# Patient Record
Sex: Female | Born: 1940 | Race: White | Hispanic: No | Smoking: Former smoker
Health system: Southern US, Community
[De-identification: ages and names within clinical notes are randomized; demographics above are authoritative.]

## PROBLEM LIST (undated history)

## (undated) DIAGNOSIS — D496 Neoplasm of unspecified behavior of brain: Secondary | ICD-10-CM

## (undated) DIAGNOSIS — K635 Polyp of colon: Secondary | ICD-10-CM

## (undated) DIAGNOSIS — I639 Cerebral infarction, unspecified: Secondary | ICD-10-CM

## (undated) DIAGNOSIS — I739 Peripheral vascular disease, unspecified: Secondary | ICD-10-CM

## (undated) DIAGNOSIS — E785 Hyperlipidemia, unspecified: Secondary | ICD-10-CM

## (undated) DIAGNOSIS — K769 Liver disease, unspecified: Secondary | ICD-10-CM

## (undated) DIAGNOSIS — R471 Dysarthria and anarthria: Secondary | ICD-10-CM

## (undated) DIAGNOSIS — H269 Unspecified cataract: Secondary | ICD-10-CM

## (undated) DIAGNOSIS — L309 Dermatitis, unspecified: Secondary | ICD-10-CM

## (undated) DIAGNOSIS — H539 Unspecified visual disturbance: Secondary | ICD-10-CM

## (undated) DIAGNOSIS — I872 Venous insufficiency (chronic) (peripheral): Secondary | ICD-10-CM

## (undated) DIAGNOSIS — I10 Essential (primary) hypertension: Secondary | ICD-10-CM

## (undated) DIAGNOSIS — C50919 Malignant neoplasm of unspecified site of unspecified female breast: Secondary | ICD-10-CM

## (undated) DIAGNOSIS — C539 Malignant neoplasm of cervix uteri, unspecified: Secondary | ICD-10-CM

## (undated) DIAGNOSIS — M8580 Other specified disorders of bone density and structure, unspecified site: Secondary | ICD-10-CM

## (undated) DIAGNOSIS — M1990 Unspecified osteoarthritis, unspecified site: Secondary | ICD-10-CM

## (undated) HISTORY — DX: Malignant neoplasm of unspecified site of unspecified female breast: C50.919

## (undated) HISTORY — PX: CERVICAL CONIZATION W/BX: SHX1330

## (undated) HISTORY — DX: Unspecified visual disturbance: H53.9

## (undated) HISTORY — DX: Hyperlipidemia, unspecified: E78.5

## (undated) HISTORY — DX: Other specified disorders of bone density and structure, unspecified site: M85.80

## (undated) HISTORY — DX: Dysarthria and anarthria: R47.1

## (undated) HISTORY — PX: CATARACT EXTRACTION W/ INTRAOCULAR LENS  IMPLANT, BILATERAL: SHX1307

## (undated) HISTORY — DX: Venous insufficiency (chronic) (peripheral): I87.2

## (undated) HISTORY — DX: Unspecified osteoarthritis, unspecified site: M19.90

## (undated) HISTORY — DX: Polyp of colon: K63.5

## (undated) HISTORY — DX: Unspecified cataract: H26.9

## (undated) HISTORY — DX: Liver disease, unspecified: K76.9

## (undated) HISTORY — DX: Cerebral infarction, unspecified: I63.9

## (undated) HISTORY — PX: TONSILLECTOMY: SUR1361

## (undated) HISTORY — DX: Dermatitis, unspecified: L30.9

## (undated) HISTORY — DX: Essential (primary) hypertension: I10

## (undated) HISTORY — DX: Neoplasm of unspecified behavior of brain: D49.6

## (undated) HISTORY — DX: Peripheral vascular disease, unspecified: I73.9

## (undated) HISTORY — DX: Malignant neoplasm of cervix uteri, unspecified: C53.9

---

## 1999-11-23 ENCOUNTER — Encounter: Admission: RE | Admit: 1999-11-23 | Discharge: 1999-11-23 | Payer: Self-pay | Admitting: Family Medicine

## 1999-11-23 ENCOUNTER — Encounter: Payer: Self-pay | Admitting: Family Medicine

## 2000-12-05 ENCOUNTER — Encounter: Admission: RE | Admit: 2000-12-05 | Discharge: 2000-12-05 | Payer: Self-pay | Admitting: Family Medicine

## 2000-12-05 ENCOUNTER — Encounter: Payer: Self-pay | Admitting: Family Medicine

## 2005-10-03 ENCOUNTER — Ambulatory Visit: Payer: Self-pay | Admitting: Gastroenterology

## 2005-10-20 ENCOUNTER — Ambulatory Visit: Payer: Self-pay | Admitting: Gastroenterology

## 2005-10-20 ENCOUNTER — Encounter (INDEPENDENT_AMBULATORY_CARE_PROVIDER_SITE_OTHER): Payer: Self-pay | Admitting: *Deleted

## 2009-12-06 ENCOUNTER — Encounter: Admission: RE | Admit: 2009-12-06 | Discharge: 2009-12-06 | Payer: Self-pay | Admitting: Family Medicine

## 2010-02-22 ENCOUNTER — Encounter: Admission: RE | Admit: 2010-02-22 | Discharge: 2010-02-22 | Payer: Self-pay | Admitting: Diagnostic Radiology

## 2010-03-15 ENCOUNTER — Ambulatory Visit
Admission: RE | Admit: 2010-03-15 | Discharge: 2010-03-15 | Payer: Self-pay | Source: Home / Self Care | Attending: General Surgery | Admitting: General Surgery

## 2010-03-15 HISTORY — PX: BREAST LUMPECTOMY: SHX2

## 2010-03-23 ENCOUNTER — Ambulatory Visit: Payer: Self-pay | Admitting: Oncology

## 2010-04-13 ENCOUNTER — Ambulatory Visit
Admission: RE | Admit: 2010-04-13 | Discharge: 2010-05-10 | Payer: Self-pay | Source: Home / Self Care | Attending: Radiation Oncology | Admitting: Radiation Oncology

## 2010-04-20 ENCOUNTER — Ambulatory Visit (HOSPITAL_COMMUNITY)
Admission: RE | Admit: 2010-04-20 | Discharge: 2010-04-20 | Payer: Self-pay | Source: Home / Self Care | Attending: Oncology | Admitting: Oncology

## 2010-04-22 ENCOUNTER — Ambulatory Visit: Payer: Self-pay | Admitting: Oncology

## 2010-04-23 LAB — VITAMIN D 25 HYDROXY (VIT D DEFICIENCY, FRACTURES): Vit D, 25-Hydroxy: 41 ng/mL (ref 30–89)

## 2010-05-11 ENCOUNTER — Ambulatory Visit: Payer: Medicare Other | Admitting: Radiation Oncology

## 2010-06-16 ENCOUNTER — Other Ambulatory Visit: Payer: Self-pay | Admitting: Family Medicine

## 2010-06-16 ENCOUNTER — Ambulatory Visit
Admission: RE | Admit: 2010-06-16 | Discharge: 2010-06-16 | Disposition: A | Discharge status: Home / Self Care | Payer: Medicare Other | Source: Ambulatory Visit | Attending: Family Medicine | Admitting: Family Medicine

## 2010-06-16 DIAGNOSIS — R52 Pain, unspecified: Secondary | ICD-10-CM

## 2010-06-21 LAB — CBC
HCT: 43.7 % (ref 36.0–46.0)
MCH: 30.7 pg (ref 26.0–34.0)
MCHC: 33.6 g/dL (ref 30.0–36.0)
MCV: 91.2 fL (ref 78.0–100.0)
RDW: 13.2 % (ref 11.5–15.5)

## 2010-06-21 LAB — PROTIME-INR
INR: 1.05 (ref 0.00–1.49)
Prothrombin Time: 13.9 seconds (ref 11.6–15.2)

## 2010-06-21 LAB — DIFFERENTIAL
Lymphs Abs: 2 10*3/uL (ref 0.7–4.0)
Monocytes Relative: 9 % (ref 3–12)
Neutro Abs: 4.6 10*3/uL (ref 1.7–7.7)
Neutrophils Relative %: 61 % (ref 43–77)

## 2010-06-21 LAB — COMPREHENSIVE METABOLIC PANEL
BUN: 19 mg/dL (ref 6–23)
CO2: 27 mEq/L (ref 19–32)
Calcium: 9.6 mg/dL (ref 8.4–10.5)
Creatinine, Ser: 0.95 mg/dL (ref 0.4–1.2)
GFR calc non Af Amer: 58 mL/min — ABNORMAL LOW (ref >60)
Glucose, Bld: 91 mg/dL (ref 70–99)
Total Protein: 7.3 g/dL (ref 6.0–8.3)

## 2010-06-21 LAB — CANCER ANTIGEN 27.29: CA 27.29: 26 U/mL (ref 0–39)

## 2010-07-22 ENCOUNTER — Other Ambulatory Visit: Payer: Self-pay | Admitting: Oncology

## 2010-07-22 ENCOUNTER — Encounter (HOSPITAL_BASED_OUTPATIENT_CLINIC_OR_DEPARTMENT_OTHER): Payer: Medicare Other | Admitting: Oncology

## 2010-07-22 DIAGNOSIS — C50919 Malignant neoplasm of unspecified site of unspecified female breast: Secondary | ICD-10-CM

## 2010-07-22 LAB — CBC WITH DIFFERENTIAL/PLATELET
BASO%: 0.7 % (ref 0.0–2.0)
EOS%: 3 % (ref 0.0–7.0)
HCT: 38.5 % (ref 34.8–46.6)
HGB: 12.8 g/dL (ref 11.6–15.9)
MCH: 29.7 pg (ref 25.1–34.0)
MCHC: 33.3 g/dL (ref 31.5–36.0)
MONO#: 0.6 10*3/uL (ref 0.1–0.9)
NEUT%: 66.7 % (ref 38.4–76.8)
RDW: 13.4 % (ref 11.2–14.5)
WBC: 7.3 10*3/uL (ref 3.9–10.3)
lymph#: 1.6 10*3/uL (ref 0.9–3.3)

## 2010-07-22 LAB — VITAMIN D 25 HYDROXY (VIT D DEFICIENCY, FRACTURES): Vit D, 25-Hydroxy: 45 ng/mL (ref 30–89)

## 2010-07-22 LAB — COMPREHENSIVE METABOLIC PANEL
ALT: 13 U/L (ref 0–35)
AST: 18 U/L (ref 0–37)
Albumin: 4.1 g/dL (ref 3.5–5.2)
CO2: 26 mEq/L (ref 19–32)
Calcium: 9.7 mg/dL (ref 8.4–10.5)
Chloride: 98 mEq/L (ref 96–112)
Creatinine, Ser: 0.81 mg/dL (ref 0.40–1.20)
Potassium: 3.8 mEq/L (ref 3.5–5.3)
Total Protein: 6.9 g/dL (ref 6.0–8.3)

## 2010-11-10 ENCOUNTER — Other Ambulatory Visit: Payer: Self-pay | Admitting: Family Medicine

## 2010-11-10 ENCOUNTER — Ambulatory Visit
Admission: RE | Admit: 2010-11-10 | Discharge: 2010-11-10 | Disposition: A | Discharge status: Home / Self Care | Payer: Medicare Other | Source: Ambulatory Visit | Attending: Family Medicine | Admitting: Family Medicine

## 2010-11-10 DIAGNOSIS — M25561 Pain in right knee: Secondary | ICD-10-CM

## 2010-11-10 DIAGNOSIS — M25562 Pain in left knee: Secondary | ICD-10-CM

## 2010-12-26 ENCOUNTER — Ambulatory Visit (INDEPENDENT_AMBULATORY_CARE_PROVIDER_SITE_OTHER): Payer: Medicare Other | Admitting: General Surgery

## 2010-12-26 ENCOUNTER — Encounter (INDEPENDENT_AMBULATORY_CARE_PROVIDER_SITE_OTHER): Payer: Self-pay | Admitting: General Surgery

## 2010-12-26 VITALS — BP 138/88 | HR 72

## 2010-12-26 DIAGNOSIS — C50412 Malignant neoplasm of upper-outer quadrant of left female breast: Secondary | ICD-10-CM | POA: Insufficient documentation

## 2010-12-26 DIAGNOSIS — C50912 Malignant neoplasm of unspecified site of left female breast: Secondary | ICD-10-CM

## 2010-12-26 DIAGNOSIS — C50919 Malignant neoplasm of unspecified site of unspecified female breast: Secondary | ICD-10-CM

## 2010-12-26 NOTE — Progress Notes (Signed)
Operation:  Left partial mastectomy and sentinel lymph node biopsy for left breast cancer  Date:  03/15/2010  Stage:  T1cN0  Hormone receptor status:  Positive  HPI:  Tracy Norman is here for followup of her left breast cancer. Overall she's doing well. She denies any masses in her breast. She denies any skin changes. She denies any adenopathy.  PE:  Gen.-well-developed, well-nourished, in no acute distress.  Left breast-well-healed lateral scar; no dominant mass, suspicious skin change, or nipple discharge.  Right breast-no dominant mass, suspicious skin change, or nipple discharge.  Assessment: T1cN0 left breast cancer-no clinical evidence of recurrence; mammogram due in October of this year.  Plan:  Return visit in 4 months.

## 2011-01-17 ENCOUNTER — Encounter (HOSPITAL_BASED_OUTPATIENT_CLINIC_OR_DEPARTMENT_OTHER): Payer: Medicare Other | Admitting: Oncology

## 2011-01-17 ENCOUNTER — Other Ambulatory Visit: Payer: Self-pay | Admitting: Physician Assistant

## 2011-01-17 DIAGNOSIS — C50919 Malignant neoplasm of unspecified site of unspecified female breast: Secondary | ICD-10-CM

## 2011-01-17 DIAGNOSIS — E559 Vitamin D deficiency, unspecified: Secondary | ICD-10-CM

## 2011-01-17 LAB — CBC WITH DIFFERENTIAL/PLATELET
Basophils Absolute: 0 10*3/uL (ref 0.0–0.1)
Eosinophils Absolute: 0.2 10*3/uL (ref 0.0–0.5)
HCT: 37.3 % (ref 34.8–46.6)
HGB: 12.5 g/dL (ref 11.6–15.9)
MONO#: 0.5 10*3/uL (ref 0.1–0.9)
NEUT%: 70.1 % (ref 38.4–76.8)
WBC: 7.4 10*3/uL (ref 3.9–10.3)
lymph#: 1.5 10*3/uL (ref 0.9–3.3)

## 2011-01-17 LAB — COMPREHENSIVE METABOLIC PANEL
ALT: <8 U/L (ref 0–35)
BUN: 25 mg/dL — ABNORMAL HIGH (ref 6–23)
CO2: 25 mEq/L (ref 19–32)
Calcium: 9.9 mg/dL (ref 8.4–10.5)
Chloride: 102 mEq/L (ref 96–112)
Creatinine, Ser: 0.9 mg/dL (ref 0.50–1.10)

## 2011-01-18 ENCOUNTER — Encounter: Payer: Medicare Other | Admitting: Oncology

## 2011-05-13 ENCOUNTER — Telehealth: Payer: Self-pay | Admitting: *Deleted

## 2011-05-13 NOTE — Telephone Encounter (Signed)
Notified pt of future appts.(07/19/2011 , lab @1130  and Dr Donnie Coffin @1200 )

## 2011-05-15 ENCOUNTER — Other Ambulatory Visit: Payer: Self-pay | Admitting: *Deleted

## 2011-05-15 DIAGNOSIS — C50919 Malignant neoplasm of unspecified site of unspecified female breast: Secondary | ICD-10-CM

## 2011-05-15 MED ORDER — ANASTROZOLE 1 MG PO TABS: 1.0000 mg | ORAL_TABLET | Freq: Every day | ORAL | Status: DC

## 2011-05-16 ENCOUNTER — Ambulatory Visit (INDEPENDENT_AMBULATORY_CARE_PROVIDER_SITE_OTHER): Payer: Medicare Other | Admitting: General Surgery

## 2011-05-16 ENCOUNTER — Encounter (INDEPENDENT_AMBULATORY_CARE_PROVIDER_SITE_OTHER): Payer: Self-pay | Admitting: General Surgery

## 2011-05-16 VITALS — BP 122/76 | HR 70 | Temp 97.8°F | Resp 18 | Ht 60.0 in | Wt 178.6 lb

## 2011-05-16 DIAGNOSIS — Z853 Personal history of malignant neoplasm of breast: Secondary | ICD-10-CM

## 2011-05-16 NOTE — Patient Instructions (Signed)
Call us if you find any new lumps in our breast.

## 2011-05-16 NOTE — Progress Notes (Signed)
Operation:  Left partial mastectomy and sentinel lymph node biopsy for left breast cancer  Date:  03/15/2010  Stage:  T1cN0  Hormone receptor status:  Positive  HPI:  Tracy Norman is here for followup of her left breast cancer. Overall she's doing well. She denies any masses in her breast. She denies any skin changes. She denies any adenopathy.  She had a mammogram in October of 2012 that was a BIRADS-2.  PE:  Gen.-well-developed, well-nourished, in no acute distress.  Left breast-well-healed lateral scar; no dominant mass, suspicious skin change, or nipple discharge.  Right breast-no dominant mass, suspicious skin change, or nipple discharge.  Assessment: T1cN0 left breast cancer-no clinical or radiographic evidence of recurrence.  Plan:  Return visit in 4 months.

## 2011-07-14 ENCOUNTER — Telehealth: Payer: Self-pay | Admitting: *Deleted

## 2011-07-14 NOTE — Telephone Encounter (Signed)
left voice message to inform the patient of the new date and time 07-17-2011 starting at 11:00am

## 2011-07-17 ENCOUNTER — Ambulatory Visit: Payer: Medicare Other | Admitting: Oncology

## 2011-07-17 ENCOUNTER — Other Ambulatory Visit: Payer: Medicare Other | Admitting: Lab

## 2011-07-19 ENCOUNTER — Ambulatory Visit: Payer: Medicare Other | Admitting: Oncology

## 2011-07-19 ENCOUNTER — Other Ambulatory Visit: Payer: Medicare Other | Admitting: Lab

## 2011-07-31 ENCOUNTER — Encounter (INDEPENDENT_AMBULATORY_CARE_PROVIDER_SITE_OTHER): Payer: Self-pay | Admitting: *Deleted

## 2011-08-07 ENCOUNTER — Telehealth: Payer: Self-pay | Admitting: *Deleted

## 2011-08-07 NOTE — Telephone Encounter (Signed)
left message with patient's mother about 08-10-2011 asked that the patient please call dr.rubin's office for the new date and time on 08-17-2011 at 3:15pm

## 2011-08-10 ENCOUNTER — Telehealth: Payer: Self-pay | Admitting: Oncology

## 2011-08-10 ENCOUNTER — Ambulatory Visit: Payer: Medicare Other | Admitting: Oncology

## 2011-08-10 NOTE — Telephone Encounter (Signed)
Pt called to confirm appt for 5/9

## 2011-08-17 ENCOUNTER — Other Ambulatory Visit: Payer: Self-pay | Admitting: *Deleted

## 2011-08-17 ENCOUNTER — Ambulatory Visit (HOSPITAL_BASED_OUTPATIENT_CLINIC_OR_DEPARTMENT_OTHER): Payer: Medicare Other | Admitting: Physician Assistant

## 2011-08-17 ENCOUNTER — Telehealth: Payer: Self-pay | Admitting: *Deleted

## 2011-08-17 ENCOUNTER — Encounter: Payer: Self-pay | Admitting: Physician Assistant

## 2011-08-17 VITALS — BP 120/75 | HR 101 | Temp 98.4°F | Ht 60.0 in | Wt 186.9 lb

## 2011-08-17 DIAGNOSIS — C50919 Malignant neoplasm of unspecified site of unspecified female breast: Secondary | ICD-10-CM

## 2011-08-17 DIAGNOSIS — C50419 Malignant neoplasm of upper-outer quadrant of unspecified female breast: Secondary | ICD-10-CM

## 2011-08-17 DIAGNOSIS — C50912 Malignant neoplasm of unspecified site of left female breast: Secondary | ICD-10-CM

## 2011-08-17 MED ORDER — ANASTROZOLE 1 MG PO TABS: 1.0000 mg | ORAL_TABLET | Freq: Every day | ORAL | Status: DC

## 2011-08-17 NOTE — Telephone Encounter (Signed)
gave patient appointment for 02-2012 printed out calendar and gave to the patient 

## 2011-08-17 NOTE — Progress Notes (Signed)
Hematology and Oncology Follow Up Visit  Tracy Norman [id number] 03-21-1941 71 y.o. 08/17/2011    HPI: Tracy Norman is a 71 year old [place] woman with a history of a stage I, ER/PR positive (75/10% respectively), HER-2 negative left breast carcinoma diagnosed November of 2011. 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 She completed radiation therapy, and is currently on anastrozole 1 mg by mouth daily.  Interim History:   Tracy Norman is seen today for follow pertain to her history of a stage I ER/PR positive left breast carcinoma. She continues on anastrozole 1 mg per day, and tolerating it quite well. She underwent her annual mammogram at [facility] in October of 2012 and all was well. She denies any fevers, chills, night sweats, shortness of breath, or chest pain. No nausea, emesis, diarrhea, or constipation issues. She denies any diffuse bone pain, no joint sensitivity. She is not troubled overly by hot flashes. Her medication list has been reviewed and updated. A detailed review of systems is otherwise noncontributory as noted below.  Review of Systems: Constitutional:  no weight loss, fever, night sweats and feels well Eyes: no complaints ENT: no complaints Cardiovascular: no chest pain or dyspnea on exertion Respiratory: no cough, shortness of breath, or wheezing Neurological: no TIA or stroke symptoms Dermatological: negative Gastrointestinal: no abdominal pain, change in bowel habits, or black or bloody stools Genito-Urinary: no dysuria, trouble voiding, or hematuria Hematological and Lymphatic: negative Breast: negative Musculoskeletal: negative Remaining ROS negative.  Medications:   I have reviewed the patient's current medications.  Current Outpatient Prescriptions  Medication Sig Dispense Refill  . amiloride-hydrochlorothiazide  (MODURETIC) 5-50 MG tablet       . anastrozole (ARIMIDEX) 1 MG tablet Take 1 tablet (1 mg total) by mouth daily.  30 tablet  0  . aspirin 81 MG tablet Take 81 mg by mouth daily.        . calcium carbonate (OS-CAL) 600 MG TABS Take 600 mg by mouth 2 (two) times daily with a meal.      . Cholecalciferol (VITAMIN D PO) Take 200 mg elemental calcium/kg/hr by mouth.        . etodolac (LODINE) 400 MG tablet Take 400 mg by mouth daily.      Marland Kitchen lisinopril (PRINIVIL,ZESTRIL) 40 MG tablet       . omeprazole (PRILOSEC) 20 MG capsule       . potassium chloride (K-DUR) 10 MEQ tablet       . simvastatin (ZOCOR) 20 MG tablet Take 20 mg by mouth daily.        Allergies:  Allergies  Allergen Reactions  . Penicillins Hives    All over the body.    Physical Exam: Filed Vitals:   08/17/11 1456  BP: 120/75  Pulse: 101  Temp: 98.4 F (36.9 C)    Body mass index is 36.50 kg/(m^2). Weight: 186 lbs. HEENT:  Sclerae anicteric, conjunctivae pink.  Oropharynx clear.  No mucositis or candidiasis.   Nodes:  No cervical, supraclavicular, or axillary lymphadenopathy palpated.  Breast Exam:  Left breast lumpectomy scar is benign without any dominant masses no nipple inversion or discharge axilla is clear, the right breast is free of masses skin changes nipple inversion or discharge axilla clear.  Lungs:  Clear to auscultation bilaterally.  No crackles, rhonchi, or wheezes.   Heart:  Regular rate and rhythm.   Abdomen:  Soft, nontender.  Positive bowel sounds.  No organomegaly or masses palpated.   Musculoskeletal:  No focal spinal tenderness to palpation.  Extremities:  Benign.  No peripheral edema or cyanosis.   Skin:  Benign.   Neuro:  Nonfocal, alert and oriented x 3.   Lab Results: Lab Results  Component Value Date   WBC 7.4 01/17/2011   HGB 12.5 01/17/2011   HCT 37.3 01/17/2011   MCV 88.0 01/17/2011   PLT 290 01/17/2011   NEUTROABS 5.2 01/17/2011     Chemistry      Component Value Date/Time   NA  140 01/17/2011 1348   K 4.2 01/17/2011 1348   CL 102 01/17/2011 1348   CO2 25 01/17/2011 1348   BUN 25* 01/17/2011 1348   CREATININE 0.90 01/17/2011 1348      Component Value Date/Time   CALCIUM 9.9 01/17/2011 1348   ALKPHOS 98 01/17/2011 1348   AST 15 01/17/2011 1348   ALT <8 01/17/2011 1348   BILITOT 0.4 01/17/2011 1348      Lab Results  Component Value Date   LABCA2 26 03/14/2010    Assessment:  Tracy Norman is a 71 year old [place] woman with a history of a stage I, ER/PR positive (75/10% respectively), HER-2 negative left breast carcinoma diagnosed November of 2011. 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 She completed radiation therapy, and is currently on anastrozole 1 mg by mouth daily.  No evidence of suggest recurrent or metastatic disease.  Case to be reviewed with Dr. Pierce Crane upon his return.  Plan:  Patient will continue on anastrozole 1 mg by mouth daily. Refill prescription for 90 day supply was provided. I've also given her prescription for a CA 27-29 to be obtained with her next primary care lab office straw which will be within the next month. We will see her back in 6 months for routine oncologic followup, to include a CBC, serum chemistries, LDH, and CA 27-29. Her primary care physician Dr. Duaine Dredge schedules her annual mammograms through [facility] in October. She knows that we would be happy to see her prior to her six-month followup if the need should arise. This plan was reviewed with the patient, who voices understanding and agreement.  She knows to call with any changes or problems.    Ngoc Daughtridge T, PA-C 08/17/2011

## 2011-09-25 ENCOUNTER — Encounter (INDEPENDENT_AMBULATORY_CARE_PROVIDER_SITE_OTHER): Payer: Self-pay | Admitting: General Surgery

## 2011-09-25 ENCOUNTER — Ambulatory Visit (INDEPENDENT_AMBULATORY_CARE_PROVIDER_SITE_OTHER): Payer: Medicare Other | Admitting: General Surgery

## 2011-09-25 VITALS — BP 130/68 | HR 72 | Temp 97.9°F | Resp 16 | Ht 60.0 in | Wt 184.0 lb

## 2011-09-25 DIAGNOSIS — Z853 Personal history of malignant neoplasm of breast: Secondary | ICD-10-CM

## 2011-09-25 NOTE — Progress Notes (Signed)
Operation:  Left partial mastectomy and sentinel lymph node biopsy for left breast cancer  Date:  03/15/2010  Stage:  T1cN0  Hormone receptor status:  Positive  HPI:  Tracy Norman is here for followup of her left breast cancer.  She denies any masses in her breast. She denies any adenopathy.  She had a mammogram in October of 2012 that was a BIRADS-2.  She is tolerating the Arimidex.  PE:  Gen.-well-developed, well-nourished, in no acute distress.  Left breast-well-healed lateral scar; no dominant mass, suspicious skin change, or nipple discharge.  Right breast-no dominant mass, suspicious skin change, or nipple discharge.  Assessment: T1cN0 left breast cancer-no clinical  evidence of recurrence.  Plan:  Return visit in 3 months.

## 2011-09-25 NOTE — Patient Instructions (Signed)
Call us if you discover any new breast lumps.

## 2012-01-01 ENCOUNTER — Ambulatory Visit (INDEPENDENT_AMBULATORY_CARE_PROVIDER_SITE_OTHER): Payer: Medicare Other | Admitting: General Surgery

## 2012-01-01 ENCOUNTER — Encounter (INDEPENDENT_AMBULATORY_CARE_PROVIDER_SITE_OTHER): Payer: Self-pay | Admitting: General Surgery

## 2012-01-01 VITALS — BP 138/76 | HR 88 | Temp 97.3°F | Resp 16 | Ht 60.0 in | Wt 190.2 lb

## 2012-01-01 DIAGNOSIS — Z853 Personal history of malignant neoplasm of breast: Secondary | ICD-10-CM

## 2012-01-01 NOTE — Progress Notes (Signed)
Operation:  Left partial mastectomy and sentinel lymph node biopsy for left breast cancer  Date:  03/15/2010  Stage:  T1cN0  Hormone receptor status:  Positive  HPI:  Tracy Norman is here for followup of her left breast cancer.  She denies any masses in her breast. She denies any adenopathy.  She is tolerating the Arimidex.  PE:  Gen.-well-developed, well-nourished, in no acute distress.  Left breast-well-healed lateral scar; no dominant mass, suspicious skin change, or nipple discharge.  Right breast-no dominant mass, suspicious skin change, or nipple discharge.  Assessment: T1cN0 left breast cancer-no clinical  evidence of recurrence.  Plan:  Return visit in 3 months.

## 2012-01-01 NOTE — Patient Instructions (Signed)
Call if you feel any new masses in your breasts. 

## 2012-02-19 ENCOUNTER — Telehealth: Payer: Self-pay | Admitting: *Deleted

## 2012-02-19 NOTE — Telephone Encounter (Signed)
per staff message moved patient appointment to 02-26-2012 starting at1:00pm

## 2012-02-20 ENCOUNTER — Other Ambulatory Visit: Payer: Medicare Other | Admitting: Lab

## 2012-02-20 ENCOUNTER — Ambulatory Visit: Payer: Medicare Other | Admitting: Oncology

## 2012-02-26 ENCOUNTER — Ambulatory Visit (HOSPITAL_BASED_OUTPATIENT_CLINIC_OR_DEPARTMENT_OTHER): Payer: Medicare Other | Admitting: Oncology

## 2012-02-26 ENCOUNTER — Telehealth: Payer: Self-pay | Admitting: Oncology

## 2012-02-26 ENCOUNTER — Other Ambulatory Visit (HOSPITAL_BASED_OUTPATIENT_CLINIC_OR_DEPARTMENT_OTHER): Payer: Medicare Other | Admitting: Lab

## 2012-02-26 VITALS — BP 119/67 | HR 83 | Temp 98.2°F | Resp 18 | Ht 60.0 in | Wt 189.1 lb

## 2012-02-26 DIAGNOSIS — C50912 Malignant neoplasm of unspecified site of left female breast: Secondary | ICD-10-CM

## 2012-02-26 DIAGNOSIS — C50919 Malignant neoplasm of unspecified site of unspecified female breast: Secondary | ICD-10-CM

## 2012-02-26 DIAGNOSIS — E559 Vitamin D deficiency, unspecified: Secondary | ICD-10-CM

## 2012-02-26 DIAGNOSIS — C50419 Malignant neoplasm of upper-outer quadrant of unspecified female breast: Secondary | ICD-10-CM

## 2012-02-26 LAB — CBC WITH DIFFERENTIAL/PLATELET
BASO%: 0.7 % (ref 0.0–2.0)
EOS%: 5.3 % (ref 0.0–7.0)
HCT: 40.1 % (ref 34.8–46.6)
LYMPH%: 20.8 % (ref 14.0–49.7)
MCH: 31.8 pg (ref 25.1–34.0)
MCHC: 34.5 g/dL (ref 31.5–36.0)
NEUT%: 65.4 % (ref 38.4–76.8)
RBC: 4.35 10*6/uL (ref 3.70–5.45)
lymph#: 1.3 10*3/uL (ref 0.9–3.3)

## 2012-02-26 LAB — COMPREHENSIVE METABOLIC PANEL (CC13)
Alkaline Phosphatase: 93 U/L (ref 40–150)
BUN: 34 mg/dL — ABNORMAL HIGH (ref 7.0–26.0)
Glucose: 98 mg/dl (ref 70–99)
Total Bilirubin: 0.6 mg/dL (ref 0.20–1.20)

## 2012-02-26 NOTE — Telephone Encounter (Signed)
gve the pt her may 2014 appt calendar 

## 2012-02-26 NOTE — Progress Notes (Signed)
Hematology and Oncology Follow Up Visit  Tracy Norman [id number] 1940/11/06 71 y.o. 02/26/2012    HPI: Tracy Norman is a 71 year old [place] woman with a history of a stage I, ER/PR positive (75/10% respectively), HER-2 negative left breast carcinoma diagnosed November of 2011. 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 She completed radiation therapy, and is currently on anastrozole 1 mg by mouth daily.  Interim History:   Tracy Norman is seen today for follow pertain to her history of a stage I ER/PR positive left breast carcinoma. She is been doing well on the Arimidex. She is due for followup mammogram and bone density test later this week. She is other complaints. Her other medications are the same. A detailed review of systems is otherwise noncontributory as noted below.  Review of Systems: Constitutional:  no weight loss, fever, night sweats and feels well Eyes: no complaints ENT: no complaints Cardiovascular: no chest pain or dyspnea on exertion Respiratory: no cough, shortness of breath, or wheezing Neurological: no TIA or stroke symptoms Dermatological: negative Gastrointestinal: no abdominal pain, change in bowel habits, or black or bloody stools Genito-Urinary: no dysuria, trouble voiding, or hematuria Hematological and Lymphatic: negative Breast: negative Musculoskeletal: negative Remaining ROS negative.  Medications:   I have reviewed the patient's current medications.  Current Outpatient Prescriptions  Medication Sig Dispense Refill  . amiloride-hydrochlorothiazide (MODURETIC) 5-50 MG tablet       . anastrozole (ARIMIDEX) 1 MG tablet Take 1 tablet (1 mg total) by mouth daily.  30 tablet  0  . aspirin 81 MG tablet Take 81 mg by mouth daily.        . calcium carbonate (OS-CAL) 600 MG TABS Take 600 mg by mouth 2 (two) times  daily with a meal.      . Cholecalciferol (VITAMIN D PO) Take 200 mg elemental calcium/kg/hr by mouth.        . etodolac (LODINE) 400 MG tablet Take 400 mg by mouth daily.      Marland Kitchen lisinopril (PRINIVIL,ZESTRIL) 40 MG tablet       . omeprazole (PRILOSEC) 20 MG capsule       . potassium chloride (K-DUR) 10 MEQ tablet       . rosuvastatin (CRESTOR) 20 MG tablet Take 20 mg by mouth daily.        Allergies:  Allergies  Allergen Reactions  . Penicillins Hives    All over the body.    Physical Exam: Filed Vitals:   02/26/12 1338  BP: 119/67  Pulse: 83  Temp: 98.2 F (36.8 C)  Resp: 18    Body mass index is 36.93 kg/(m^2). Weight: 186 lbs. HEENT:  Sclerae anicteric, conjunctivae pink.  Oropharynx clear.  No mucositis or candidiasis.   Nodes:  No cervical, supraclavicular, or axillary lymphadenopathy palpated.  Breast Exam:  Left breast lumpectomy scar is benign without any dominant masses no nipple inversion or discharge axilla is clear, the right breast is free of masses skin changes nipple inversion or discharge axilla clear.  Lungs:  Clear to auscultation bilaterally.  No crackles, rhonchi, or wheezes.   Heart:  Regular rate and rhythm.   Abdomen:  Soft, nontender.  Positive bowel sounds.  No organomegaly or masses palpated.   Musculoskeletal:  No focal spinal tenderness to palpation.  Extremities:  Benign.  No peripheral edema or cyanosis.   Skin:  Benign.  Neuro:  Nonfocal, alert and oriented x 3.   Lab Results: Lab Results  Component Value Date   WBC 6.2 02/26/2012   HGB 13.8 02/26/2012   HCT 40.1 02/26/2012   MCV 92.4 02/26/2012   PLT 231 02/26/2012   NEUTROABS 4.1 02/26/2012     Chemistry      Component Value Date/Time   NA 140 02/26/2012 1326   NA 140 01/17/2011 1348   K 4.4 02/26/2012 1326   K 4.2 01/17/2011 1348   CL 106 02/26/2012 1326   CL 102 01/17/2011 1348   CO2 28 02/26/2012 1326   CO2 25 01/17/2011 1348   BUN 34.0* 02/26/2012 1326   BUN 25* 01/17/2011  1348   CREATININE 1.1 02/26/2012 1326   CREATININE 0.90 01/17/2011 1348      Component Value Date/Time   CALCIUM 10.5* 02/26/2012 1326   CALCIUM 9.9 01/17/2011 1348   ALKPHOS 93 02/26/2012 1326   ALKPHOS 98 01/17/2011 1348   AST 23 02/26/2012 1326   AST 15 01/17/2011 1348   ALT 24 02/26/2012 1326   ALT <8 01/17/2011 1348   BILITOT 0.60 02/26/2012 1326   BILITOT 0.4 01/17/2011 1348      Lab Results  Component Value Date   LABCA2 26 03/14/2010    Assessment:  Tracy Norman is a 71 year old [place] woman with a history of a stage I, ER/PR positive (75/10% respectively), HER-2 negative left breast carcinoma diagnosed November of 2011. 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 She completed radiation therapy, and is currently on anastrozole 1 mg by mouth daily.  No evidence of suggest recurrent or metastatic disease.    Plan:  Patient will continue on anastrozole 1 mg by mouth daily. I will continue to see her at six-month intervals. We'll look forward to seeing her results of her bone density test and mammogram. I've encouraged her to take additional vitamin D. as she has been doing. This plan was reviewed with the patient, who voices understanding and agreement.  She knows to call with any changes or problems.    Pierce Crane, MD 02/26/2012

## 2012-02-27 LAB — CANCER ANTIGEN 27.29: CA 27.29: 31 U/mL (ref 0–39)

## 2012-02-28 ENCOUNTER — Ambulatory Visit (INDEPENDENT_AMBULATORY_CARE_PROVIDER_SITE_OTHER): Payer: Medicare Other | Admitting: *Deleted

## 2012-02-28 DIAGNOSIS — I6529 Occlusion and stenosis of unspecified carotid artery: Secondary | ICD-10-CM

## 2012-02-28 DIAGNOSIS — R0989 Other specified symptoms and signs involving the circulatory and respiratory systems: Secondary | ICD-10-CM

## 2012-04-08 ENCOUNTER — Ambulatory Visit (INDEPENDENT_AMBULATORY_CARE_PROVIDER_SITE_OTHER): Payer: Medicare Other | Admitting: General Surgery

## 2012-04-08 ENCOUNTER — Encounter (INDEPENDENT_AMBULATORY_CARE_PROVIDER_SITE_OTHER): Payer: Self-pay | Admitting: General Surgery

## 2012-04-08 VITALS — BP 142/68 | HR 84 | Temp 97.0°F | Resp 12 | Ht 59.75 in | Wt 194.2 lb

## 2012-04-08 DIAGNOSIS — Z853 Personal history of malignant neoplasm of breast: Secondary | ICD-10-CM

## 2012-04-08 NOTE — Progress Notes (Signed)
Operation:  Left partial mastectomy and sentinel lymph node biopsy for left breast cancer  Date:  03/15/2010  Stage:  T1cN0  Hormone receptor status:  Positive  HPI:  Tracy Norman is here for another followup visit for her left breast cancer.  She denies any masses in her breast. She denies any adenopathy.  She is tolerating the Arimidex.  PE:  Gen.-well-developed, well-nourished, in no acute distress.  Left breast-well-healed lateral scar; no dominant mass, suspicious skin change, or nipple discharge.  Right breast-no dominant mass, suspicious skin change, or nipple discharge.  Assessment: T1cN0 left breast cancer-no clinical  evidence of recurrence.  Plan:  Return visit in 6 months.

## 2012-04-08 NOTE — Patient Instructions (Signed)
Call if you find any new lumps in your breasts. 

## 2012-05-02 ENCOUNTER — Encounter (INDEPENDENT_AMBULATORY_CARE_PROVIDER_SITE_OTHER): Payer: Self-pay

## 2012-06-29 ENCOUNTER — Encounter: Payer: Self-pay | Admitting: Oncology

## 2012-06-29 ENCOUNTER — Telehealth: Payer: Self-pay | Admitting: *Deleted

## 2012-06-29 NOTE — Telephone Encounter (Signed)
Confirmed 08/29/12 appt w/ pt.  Mailed letter & calendar to pt.

## 2012-08-29 ENCOUNTER — Telehealth: Payer: Self-pay | Admitting: *Deleted

## 2012-08-29 ENCOUNTER — Ambulatory Visit: Payer: Medicare Other | Admitting: Oncology

## 2012-08-29 ENCOUNTER — Other Ambulatory Visit: Payer: Medicare Other | Admitting: Lab

## 2012-08-29 ENCOUNTER — Ambulatory Visit (HOSPITAL_BASED_OUTPATIENT_CLINIC_OR_DEPARTMENT_OTHER): Payer: Medicare Other | Admitting: Oncology

## 2012-08-29 VITALS — BP 141/78 | HR 98 | Temp 98.4°F | Resp 20 | Ht 59.75 in | Wt 194.0 lb

## 2012-08-29 DIAGNOSIS — Z170 Estrogen receptor positive status [ER+]: Secondary | ICD-10-CM

## 2012-08-29 DIAGNOSIS — M810 Age-related osteoporosis without current pathological fracture: Secondary | ICD-10-CM

## 2012-08-29 DIAGNOSIS — C50919 Malignant neoplasm of unspecified site of unspecified female breast: Secondary | ICD-10-CM

## 2012-08-29 DIAGNOSIS — C50912 Malignant neoplasm of unspecified site of left female breast: Secondary | ICD-10-CM

## 2012-08-29 MED ORDER — ANASTROZOLE 1 MG PO TABS: 1.0000 mg | ORAL_TABLET | Freq: Every day | ORAL | Status: DC

## 2012-08-29 NOTE — Patient Instructions (Addendum)
It was a pleasure meeting you today.  We discussed your pathology and the treatment you have received in the past by Dr. Donnie Coffin.  My recommendation is to continue the Arimidex 1 mg every day. Total of 5 years of therapy is planned.  We discussed side effects of Arimidex including osteoporosis/osteopenia. Dr. Duaine Dredge is monitoring your bone density and he has you on Fosamax which I agree with.  I will plan on seeing you back in 6 months time for followup.  Please call with any problems questions or concerns.

## 2012-08-29 NOTE — Telephone Encounter (Signed)
appts made and printed...td 

## 2012-09-16 NOTE — Progress Notes (Signed)
OFFICE PROGRESS NOTE  CC  Carolyne Fiscal, MD [address] [place] [address]  DIAGNOSIS: 72 year old female with history of left breast cancer diagnosed 2011  PRIOR THERAPY:  #1 patient underwent a left partial mastectomy with sentinel node biopsy for  Left breast cancer on 03/15/2010. The pathology revealed a T1 C. N0 disease it was ER positive75% PR +10% HER-2/neu negative.  #2 she then went on to complete radiation therapy.  #3 she was then begun on anastrozole 1 mg daily. A total of 5 years of therapy is planned.  CURRENT THERAPY:Arimidex 1 mg daily.  INTERVAL HISTORY: BRITNI Norman 72 y.o. female returns forfollowup visit. She previously had been seeing Dr. Pierce Crane. Her last visit with him was in November 2013. Clinically patient seems to be doing well she is tolerating her Arimidex quite nicely without any significant problems.patient does have a little bone density and she was begun on Fosamax by her primary care physician. She has no nausea or vomiting no myalgias and arthralgias. We did discuss her pathology and treatments that she received by Dr. Donnie Coffin today  MEDICAL HISTORY: Past Medical History  Diagnosis Date  . Arthritis   . Cancer   . Hypertension     ALLERGIES:  is allergic to penicillins.  MEDICATIONS:  Current Outpatient Prescriptions  Medication Sig Dispense Refill  . amiloride-hydrochlorothiazide (MODURETIC) 5-50 MG tablet       . anastrozole (ARIMIDEX) 1 MG tablet Take 1 tablet (1 mg total) by mouth daily.  90 tablet  12  . aspirin 81 MG tablet Take 81 mg by mouth daily.        . calcium carbonate (OS-CAL) 600 MG TABS Take 600 mg by mouth 2 (two) times daily with a meal.      . Cholecalciferol (VITAMIN D PO) Take 200 mg elemental calcium/kg/hr by mouth.        . etodolac (LODINE) 400 MG tablet Take 400 mg by mouth daily.      Marland Kitchen lisinopril (PRINIVIL,ZESTRIL) 40 MG tablet       . omeprazole (PRILOSEC) 20 MG capsule       . potassium  chloride (K-DUR) 10 MEQ tablet       . rosuvastatin (CRESTOR) 20 MG tablet Take 20 mg by mouth daily.       No current facility-administered medications for this visit.    SURGICAL HISTORY:  Past Surgical History  Procedure Laterality Date  . Breast lumpectomy  03/15/10    left     REVIEW OF SYSTEMS:  Pertinent items are noted in HPI.   HEALTH MAINTENANCE:    PHYSICAL EXAMINATION: Blood pressure 141/78, pulse 98, temperature 98.4 F (36.9 C), temperature source Oral, resp. rate 20, height 4' 11.75" (1.518 m), weight 194 lb (87.998 kg). Body mass index is 38.19 kg/(m^2). ECOG PERFORMANCE STATUS: 0 - Asymptomatic   well-developed nourished female in no acute distress HEENT exam EOMI PERRLA sclerae anicteric no conjunctival pallor oral because is moist neck is supple lungs are clear bilaterally to auscultation and percussion cardiovascular is regular rate rhythm abdomen is soft nontender nondistended bowel sounds are present no HSM extremities no edema neuro patient's alert oriented otherwise nonfocal by a lateral breast examination no masses or nipple discharge. Left breast reveals well-healed surgical scar no evidence of local recurrence   LABORATORY DATA: Lab Results  Component Value Date   WBC 6.2 02/26/2012   HGB 13.8 02/26/2012   HCT 40.1 02/26/2012   MCV 92.4 02/26/2012  PLT 231 02/26/2012      Chemistry      Component Value Date/Time   NA 140 02/26/2012 1326   NA 140 01/17/2011 1348   K 4.4 02/26/2012 1326   K 4.2 01/17/2011 1348   CL 106 02/26/2012 1326   CL 102 01/17/2011 1348   CO2 28 02/26/2012 1326   CO2 25 01/17/2011 1348   BUN 34.0* 02/26/2012 1326   BUN 25* 01/17/2011 1348   CREATININE 1.1 02/26/2012 1326   CREATININE 0.90 01/17/2011 1348      Component Value Date/Time   CALCIUM 10.5* 02/26/2012 1326   CALCIUM 9.9 01/17/2011 1348   ALKPHOS 93 02/26/2012 1326   ALKPHOS 98 01/17/2011 1348   AST 23 02/26/2012 1326   AST 15 01/17/2011 1348   ALT 24  02/26/2012 1326   ALT <8 01/17/2011 1348   BILITOT 0.60 02/26/2012 1326   BILITOT 0.4 01/17/2011 1348       RADIOGRAPHIC STUDIES:  No results found.  ASSESSMENT: 72 year old female with  #1 stage I invasive ductal carcinoma of the left breast status post partial mastectomy with sentinel lymph node biopsy the final pathology revealed a 1.5 cm infiltrating ductal carcinoma 2 sentinel nodes were negative for metastatic disease tumor was ER positive PR positive HER-2/neu negative. She went on to receive adjuvant radiation therapy. Thereafter she was started on Arimidex 1 mg daily in 2012. Overall she is tolerating it well she has no evidence of local recurrence.  #2 patient had a recent bone density scan performed that did reveal osteopenia/osteoporosis she is on Fosamax. This is being managed by her primary care physician.   PLAN:   #1 continue Arimidex 1 mg daily a total of 5 years of therapy is planned.  #2 she will continue Fosamax.  #3 I will see her back in 6 months time for followup.   All questions were answered. The patient knows to call the clinic with any problems, questions or concerns. We can certainly see the patient much sooner if necessary.  I spent 25 minutes counseling the patient face to face. The total time spent in the appointment was 30 minutes.    Drue Second, MD Medical/Oncology [facility] [phone] (beeper) [phone] (Office)

## 2013-02-27 ENCOUNTER — Ambulatory Visit (HOSPITAL_COMMUNITY)
Admission: RE | Admit: 2013-02-27 | Discharge: 2013-02-27 | Disposition: A | Discharge status: Home / Self Care | Payer: Medicare Other | Source: Ambulatory Visit | Attending: Vascular Surgery | Admitting: Vascular Surgery

## 2013-02-27 ENCOUNTER — Other Ambulatory Visit (HOSPITAL_COMMUNITY): Payer: Self-pay | Admitting: Family Medicine

## 2013-02-27 DIAGNOSIS — R0989 Other specified symptoms and signs involving the circulatory and respiratory systems: Secondary | ICD-10-CM

## 2013-03-10 ENCOUNTER — Encounter: Payer: Self-pay | Admitting: Oncology

## 2013-03-10 ENCOUNTER — Telehealth: Payer: Self-pay | Admitting: Oncology

## 2013-03-10 ENCOUNTER — Encounter (INDEPENDENT_AMBULATORY_CARE_PROVIDER_SITE_OTHER): Payer: Self-pay

## 2013-03-10 ENCOUNTER — Other Ambulatory Visit (HOSPITAL_BASED_OUTPATIENT_CLINIC_OR_DEPARTMENT_OTHER): Payer: Medicare Other | Admitting: Lab

## 2013-03-10 ENCOUNTER — Ambulatory Visit (HOSPITAL_BASED_OUTPATIENT_CLINIC_OR_DEPARTMENT_OTHER): Payer: Medicare Other | Admitting: Oncology

## 2013-03-10 VITALS — BP 136/80 | HR 105 | Temp 98.6°F | Resp 18 | Ht 59.0 in | Wt 201.2 lb

## 2013-03-10 DIAGNOSIS — C50919 Malignant neoplasm of unspecified site of unspecified female breast: Secondary | ICD-10-CM

## 2013-03-10 DIAGNOSIS — C50912 Malignant neoplasm of unspecified site of left female breast: Secondary | ICD-10-CM

## 2013-03-10 DIAGNOSIS — M899 Disorder of bone, unspecified: Secondary | ICD-10-CM

## 2013-03-10 DIAGNOSIS — M810 Age-related osteoporosis without current pathological fracture: Secondary | ICD-10-CM

## 2013-03-10 DIAGNOSIS — Z170 Estrogen receptor positive status [ER+]: Secondary | ICD-10-CM

## 2013-03-10 LAB — CBC WITH DIFFERENTIAL/PLATELET
BASO%: 0.7 % (ref 0.0–2.0)
EOS%: 3.7 % (ref 0.0–7.0)
Eosinophils Absolute: 0.3 10*3/uL (ref 0.0–0.5)
HCT: 44.4 % (ref 34.8–46.6)
HGB: 14.6 g/dL (ref 11.6–15.9)
LYMPH%: 24.5 % (ref 14.0–49.7)
MCHC: 33 g/dL (ref 31.5–36.0)
MONO#: 0.5 10*3/uL (ref 0.1–0.9)
NEUT%: 65.2 % (ref 38.4–76.8)
Platelets: 244 10*3/uL (ref 145–400)
WBC: 7.9 10*3/uL (ref 3.9–10.3)
lymph#: 1.9 10*3/uL (ref 0.9–3.3)

## 2013-03-10 LAB — COMPREHENSIVE METABOLIC PANEL (CC13)
ALT: 23 U/L (ref 0–55)
AST: 26 U/L (ref 5–34)
Albumin: 4 g/dL (ref 3.5–5.0)
Alkaline Phosphatase: 65 U/L (ref 40–150)
Anion Gap: 11 mEq/L (ref 3–11)
BUN: 19.4 mg/dL (ref 7.0–26.0)
CO2: 28 mEq/L (ref 22–29)
Chloride: 104 mEq/L (ref 98–109)
Glucose: 107 mg/dl (ref 70–140)
Potassium: 4 mEq/L (ref 3.5–5.1)
Total Bilirubin: 0.38 mg/dL (ref 0.20–1.20)

## 2013-03-10 NOTE — Telephone Encounter (Signed)
, °

## 2013-03-10 NOTE — Progress Notes (Signed)
OFFICE PROGRESS NOTE  CC  Carolyne Fiscal, MD [address] [place] [address]  DIAGNOSIS: 72 year old female with history of left breast cancer diagnosed 2011  PRIOR THERAPY:  #1 patient underwent a left partial mastectomy with sentinel node biopsy for  Left breast cancer on 03/15/2010. The pathology revealed a T1 C. N0 disease it was ER positive75% PR +10% HER-2/neu negative.  #2 she then went on to complete radiation therapy.  #3 she was then begun on anastrozole 1 mg daily. A total of 5 years of therapy is planned.  CURRENT THERAPY:Arimidex 1 mg daily.  INTERVAL HISTORY: Tracy Norman 72 y.o. female returns forfollowup visit. Clinically patient seems to be doing well she is tolerating her Arimidex quite nicely without any significant problems.patient does have a little bone density and she was begun on Fosamax by her primary care physician. She has no nausea or vomiting no myalgias and arthralgias. Remainder of the 10 point review of systems is negative MEDICAL HISTORY: Past Medical History  Diagnosis Date  . Arthritis   . Cancer   . Hypertension     ALLERGIES:  is allergic to penicillins.  MEDICATIONS:  Current Outpatient Prescriptions  Medication Sig Dispense Refill  . alendronate (FOSAMAX) 70 MG tablet Take 70 mg by mouth once a week. Take with a full glass of water on an empty stomach.      Marland Kitchen amiloride-hydrochlorothiazide (MODURETIC) 5-50 MG tablet       . anastrozole (ARIMIDEX) 1 MG tablet Take 1 tablet (1 mg total) by mouth daily.  90 tablet  12  . aspirin 81 MG tablet Take 81 mg by mouth daily.        . calcium carbonate (OS-CAL) 600 MG TABS Take 600 mg by mouth 2 (two) times daily with a meal.      . Cholecalciferol (VITAMIN D PO) Take 200 mg elemental calcium/kg/hr by mouth.        . etodolac (LODINE) 400 MG tablet Take 400 mg by mouth daily.      Marland Kitchen lisinopril (PRINIVIL,ZESTRIL) 40 MG tablet       . omeprazole (PRILOSEC) 20 MG capsule       .  potassium chloride (K-DUR) 10 MEQ tablet       . rosuvastatin (CRESTOR) 20 MG tablet Take 20 mg by mouth daily.      Marland Kitchen anastrozole (ARIMIDEX) 1 MG tablet        No current facility-administered medications for this visit.    SURGICAL HISTORY:  Past Surgical History  Procedure Laterality Date  . Breast lumpectomy  03/15/10    left     REVIEW OF SYSTEMS:  Pertinent items are noted in HPI.   HEALTH MAINTENANCE:    PHYSICAL EXAMINATION: Blood pressure 136/80, pulse 105, temperature 98.6 F (37 C), temperature source Oral, resp. rate 18, height 4\' 11"  (1.499 m), weight 201 lb 3.2 oz (91.264 kg). Body mass index is 40.62 kg/(m^2). ECOG PERFORMANCE STATUS: 0 - Asymptomatic   well-developed nourished female in no acute distress HEENT exam EOMI PERRLA sclerae anicteric no conjunctival pallor oral because is moist neck is supple lungs are clear bilaterally to auscultation and percussion cardiovascular is regular rate rhythm abdomen is soft nontender nondistended bowel sounds are present no HSM extremities no edema neuro patient's alert oriented otherwise nonfocal by a lateral breast examination no masses or nipple discharge. Left breast reveals well-healed surgical scar no evidence of local recurrence   LABORATORY DATA: Lab Results  Component Value  Date   WBC 7.9 03/10/2013   HGB 14.6 03/10/2013   HCT 44.4 03/10/2013   MCV 94.7 03/10/2013   PLT 244 03/10/2013      Chemistry      Component Value Date/Time   NA 143 03/10/2013 1400   NA 140 01/17/2011 1348   K 4.0 03/10/2013 1400   K 4.2 01/17/2011 1348   CL 106 02/26/2012 1326   CL 102 01/17/2011 1348   CO2 28 03/10/2013 1400   CO2 25 01/17/2011 1348   BUN 19.4 03/10/2013 1400   BUN 25* 01/17/2011 1348   CREATININE 0.9 03/10/2013 1400   CREATININE 0.90 01/17/2011 1348      Component Value Date/Time   CALCIUM 10.7* 03/10/2013 1400   CALCIUM 9.9 01/17/2011 1348   ALKPHOS 65 03/10/2013 1400   ALKPHOS 98 01/17/2011 1348   AST 26 03/10/2013 1400    AST 15 01/17/2011 1348   ALT 23 03/10/2013 1400   ALT <8 01/17/2011 1348   BILITOT 0.38 03/10/2013 1400   BILITOT 0.4 01/17/2011 1348       RADIOGRAPHIC STUDIES:  No results found.  ASSESSMENT: 72 year old female with  #1 stage I invasive ductal carcinoma of the left breast status post partial mastectomy with sentinel lymph node biopsy the final pathology revealed a 1.5 cm infiltrating ductal carcinoma 2 sentinel nodes were negative for metastatic disease tumor was ER positive PR positive HER-2/neu negative. She went on to receive adjuvant radiation therapy. Thereafter she was started on Arimidex 1 mg daily in 2012. Overall she is tolerating it well she has no evidence of local recurrence.  #2 patient had a recent bone density scan performed that did reveal osteopenia/osteoporosis she is on Fosamax. This is being managed by her primary care physician.   PLAN:   #1 continue Arimidex 1 mg daily a total of 5 years of therapy is planned.  #2 she will continue Fosamax.  #3 I will see her back in 12 months time for followup.   All questions were answered. The patient knows to call the clinic with any problems, questions or concerns. We can certainly see the patient much sooner if necessary.  I spent 25 minutes counseling the patient face to face. The total time spent in the appointment was 30 minutes.    Drue Second, MD Medical/Oncology [facility] [phone] (beeper) [phone] (Office)

## 2013-04-15 ENCOUNTER — Encounter (INDEPENDENT_AMBULATORY_CARE_PROVIDER_SITE_OTHER): Payer: Self-pay

## 2013-04-15 NOTE — Progress Notes (Signed)
Patient ID: Tracy Norman, female   DOB: 11-23-1940, 73 y.o.   MRN: [id number] Letter sent to patient requesting she call to schedule her six month follow up with Dr. Zella Richer.  Our records show she was due to be seen June 2014.  Pt's 03/04/2013 mammogram scanned into chart.

## 2013-04-16 ENCOUNTER — Encounter (INDEPENDENT_AMBULATORY_CARE_PROVIDER_SITE_OTHER): Payer: Self-pay

## 2013-09-08 ENCOUNTER — Other Ambulatory Visit: Payer: Self-pay | Admitting: Oncology

## 2013-10-30 ENCOUNTER — Telehealth: Payer: Self-pay | Admitting: *Deleted

## 2013-10-30 NOTE — Telephone Encounter (Signed)
Notified pt of future appointments on 11/10. Pt agreed with appt time and date

## 2013-11-10 ENCOUNTER — Ambulatory Visit: Payer: Medicare Other | Admitting: Oncology

## 2013-11-10 ENCOUNTER — Other Ambulatory Visit: Payer: Medicare Other

## 2014-02-17 ENCOUNTER — Other Ambulatory Visit (HOSPITAL_BASED_OUTPATIENT_CLINIC_OR_DEPARTMENT_OTHER): Payer: Medicare Other

## 2014-02-17 ENCOUNTER — Other Ambulatory Visit: Payer: Self-pay | Admitting: *Deleted

## 2014-02-17 ENCOUNTER — Ambulatory Visit (HOSPITAL_BASED_OUTPATIENT_CLINIC_OR_DEPARTMENT_OTHER): Payer: Medicare Other | Admitting: Hematology and Oncology

## 2014-02-17 VITALS — BP 151/63 | HR 88 | Temp 98.3°F | Resp 18 | Ht 59.0 in | Wt 197.7 lb

## 2014-02-17 DIAGNOSIS — C50912 Malignant neoplasm of unspecified site of left female breast: Secondary | ICD-10-CM

## 2014-02-17 DIAGNOSIS — Z853 Personal history of malignant neoplasm of breast: Secondary | ICD-10-CM

## 2014-02-17 DIAGNOSIS — Z79811 Long term (current) use of aromatase inhibitors: Secondary | ICD-10-CM

## 2014-02-17 LAB — COMPREHENSIVE METABOLIC PANEL (CC13)
ALBUMIN: 4 g/dL (ref 3.5–5.0)
ALT: 21 U/L (ref 0–55)
AST: 23 U/L (ref 5–34)
Alkaline Phosphatase: 64 U/L (ref 40–150)
Anion Gap: 8 mEq/L (ref 3–11)
BUN: 27.4 mg/dL — ABNORMAL HIGH (ref 7.0–26.0)
CO2: 29 mEq/L (ref 22–29)
Calcium: 10.2 mg/dL (ref 8.4–10.4)
Chloride: 105 mEq/L (ref 98–109)
Creatinine: 1.3 mg/dL — ABNORMAL HIGH (ref 0.6–1.1)
Glucose: 98 mg/dl (ref 70–140)
POTASSIUM: 3.9 meq/L (ref 3.5–5.1)
SODIUM: 142 meq/L (ref 136–145)
Total Bilirubin: 0.41 mg/dL (ref 0.20–1.20)
Total Protein: 7.2 g/dL (ref 6.4–8.3)

## 2014-02-17 LAB — CBC WITH DIFFERENTIAL/PLATELET
BASO%: 0.9 % (ref 0.0–2.0)
Basophils Absolute: 0.1 10*3/uL (ref 0.0–0.1)
EOS%: 3.1 % (ref 0.0–7.0)
Eosinophils Absolute: 0.2 10*3/uL (ref 0.0–0.5)
HCT: 45 % (ref 34.8–46.6)
HEMOGLOBIN: 14.6 g/dL (ref 11.6–15.9)
LYMPH#: 1.7 10*3/uL (ref 0.9–3.3)
LYMPH%: 24.8 % (ref 14.0–49.7)
MCH: 30 pg (ref 25.1–34.0)
MCHC: 32.4 g/dL (ref 31.5–36.0)
MCV: 92.5 fL (ref 79.5–101.0)
MONO#: 0.5 10*3/uL (ref 0.1–0.9)
MONO%: 7.5 % (ref 0.0–14.0)
NEUT#: 4.4 10*3/uL (ref 1.5–6.5)
NEUT%: 63.7 % (ref 38.4–76.8)
Platelets: 230 10*3/uL (ref 145–400)
RBC: 4.86 10*6/uL (ref 3.70–5.45)
RDW: 13.3 % (ref 11.2–14.5)
WBC: 6.9 10*3/uL (ref 3.9–10.3)

## 2014-02-17 NOTE — Progress Notes (Signed)
Patient Care Team: Marylene Land, MD as PCP - General  DIAGNOSIS: 73 year old female with history of left breast cancer diagnosed 2011  PRIOR THERAPY:  #1 patient underwent a left Lumpectomy with sentinel node biopsy for Left breast cancer on 03/15/2010. The pathology revealed a T1 C. N0 disease it was ER positive75% PR +10% HER-2/neu negative.  #2 patient refused radiation therapy  #3 she was then begun on anastrozole 1 mg daily. A total of 5 years of therapy was planned.  CURRENT THERAPY:Arimidex 1 mg daily.  CHIEF COMPLIANT: followup of breast cancer  INTERVAL HISTORY: Tracy Norman is a 73 year old Caucasian with above-mentioned history of left-sided breast cancer treated with lumpectomy and she refused radiation therapy and began anastrozole therapy in 2012 and had completed nearly 4 years of treatment. Patient is here for routine followup she denies any new lumps or nodules in the breast. She uses a cane to get around. She complains of arthritis symptoms. She wants and how long she needs to take antiestrogen therapy.  REVIEW OF SYSTEMS:   Constitutional: Denies fevers, chills or abnormal weight loss Eyes: Denies blurriness of vision Ears, nose, mouth, throat, and face: Denies mucositis or sore throat Respiratory: Denies cough, dyspnea or wheezes Cardiovascular: Denies palpitation, chest discomfort or lower extremity swelling Gastrointestinal:  Denies nausea, heartburn or change in bowel habits Skin: Denies abnormal skin rashes Lymphatics: Denies new lymphadenopathy or easy bruising Neurological:Denies numbness, tingling or new weaknesses Behavioral/Psych: Mood is stable, no new changes  Breast:  denies any pain or lumps or nodules in either breasts All other systems were reviewed with the patient and are negative.  I have reviewed the past medical history, past surgical history, social history and family history with the patient and they are unchanged from previous  note.  ALLERGIES:  is allergic to penicillins.  MEDICATIONS:  Current Outpatient Prescriptions  Medication Sig Dispense Refill  . alendronate (FOSAMAX) 70 MG tablet Take 70 mg by mouth once a week. Take with a full glass of water on an empty stomach.    Marland Kitchen amiloride-hydrochlorothiazide (MODURETIC) 5-50 MG tablet     . anastrozole (ARIMIDEX) 1 MG tablet Take 1 tablet (1 mg total) by mouth daily. 90 tablet 12  . aspirin 81 MG tablet Take 81 mg by mouth daily.      . calcium carbonate (OS-CAL) 600 MG TABS Take 600 mg by mouth 2 (two) times daily with a meal.    . Cholecalciferol (VITAMIN D PO) Take 200 mg elemental calcium/kg/hr by mouth.      Marland Kitchen lisinopril (PRINIVIL,ZESTRIL) 40 MG tablet     . potassium chloride (K-DUR) 10 MEQ tablet 2 (two) times daily.     . rosuvastatin (CRESTOR) 20 MG tablet Take 20 mg by mouth daily.     No current facility-administered medications for this visit.    PHYSICAL EXAMINATION: ECOG PERFORMANCE STATUS: 1 - Symptomatic but completely ambulatory  Filed Vitals:   02/17/14 1542  BP: 151/63  Pulse: 88  Temp: 98.3 F (36.8 C)  Resp: 18   Filed Weights   02/17/14 1542  Weight: 197 lb 11.2 oz (89.676 kg)    GENERAL:alert, no distress and comfortable SKIN: skin color, texture, turgor are normal, no rashes or significant lesions EYES: normal, Conjunctiva are pink and non-injected, sclera clear OROPHARYNX:no exudate, no erythema and lips, buccal mucosa, and tongue normal  NECK: supple, thyroid normal size, non-tender, without nodularity LYMPH:  no palpable lymphadenopathy in the cervical, axillary or inguinal LUNGS: clear  to auscultation and percussion with normal breathing effort HEART: regular rate & rhythm and no murmurs and no lower extremity edema ABDOMEN:abdomen soft, non-tender and normal bowel sounds Musculoskeletal:no cyanosis of digits and no clubbing  NEURO: alert & oriented x 3 with fluent speech, no focal motor/sensory deficits BREAST: No  palpable masses or nodules in either right or left breasts. No palpable axillary supraclavicular or infraclavicular adenopathy no breast tenderness or nipple discharge.   LABORATORY DATA:  I have reviewed the data as listed   Chemistry      Component Value Date/Time   NA 142 02/17/2014 1526   NA 140 01/17/2011 1348   K 3.9 02/17/2014 1526   K 4.2 01/17/2011 1348   CL 106 02/26/2012 1326   CL 102 01/17/2011 1348   CO2 29 02/17/2014 1526   CO2 25 01/17/2011 1348   BUN 27.4* 02/17/2014 1526   BUN 25* 01/17/2011 1348   CREATININE 1.3* 02/17/2014 1526   CREATININE 0.90 01/17/2011 1348      Component Value Date/Time   CALCIUM 10.2 02/17/2014 1526   CALCIUM 9.9 01/17/2011 1348   ALKPHOS 64 02/17/2014 1526   ALKPHOS 98 01/17/2011 1348   AST 23 02/17/2014 1526   AST 15 01/17/2011 1348   ALT 21 02/17/2014 1526   ALT <8 01/17/2011 1348   BILITOT 0.41 02/17/2014 1526   BILITOT 0.4 01/17/2011 1348       Lab Results  Component Value Date   WBC 6.9 02/17/2014   HGB 14.6 02/17/2014   HCT 45.0 02/17/2014   MCV 92.5 02/17/2014   PLT 230 02/17/2014   NEUTROABS 4.4 02/17/2014   ASSESSMENT & PLAN:  Breast cancer, left breast Date left breast invasive ductal carcinoma stage I, 1.5 cm 2 SLN negative, ER/PR positive HER-2 negative status post lumpectomy, patient refused radiation therapy and has been on Arimidex 1 mg daily since 2012.  Aromatase inhibitor counseling: Patient will be seeing her primary care physician to discuss getting a bone density test for evaluation. I strongly recommended that she undergo the testing.we discussed the duration of therapy issue with aromatase inhibitors. Patient understands that the current data supports only for 5 years of aromatase inhibitor therapy. We will update breast cancer index to evaluate if she needs longer term antiestrogen therapy.  Surveillance: Patient has an appointment to undergo mammograms. Today's breast exam was normal. Return to  clinic in 6 months for follow up.  The patient had an option, she would like to continue aromatase inhibitor therapy beyond 5 years. No orders of the defined types were placed in this encounter.   The patient has a good understanding of the overall plan. she agrees with it. She will call with any problems that may develop before her next visit here.  I spent 20 minutes counseling the patient face to face. The total time spent in the appointment was 25 minutes and more than 50% was on counseling and review of test results    Rulon Eisenmenger, MD 02/17/2014 4:30 PM

## 2014-02-17 NOTE — Assessment & Plan Note (Signed)
Date left breast invasive ductal carcinoma stage I, 1.5 cm 2 SLN negative, ER/PR positive HER-2 negative status post lumpectomy, patient refused radiation therapy and has been on Arimidex 1 mg daily since 2012.  Aromatase inhibitor counseling: Patient will be seeing her primary care physician to discuss getting a bone density test for evaluation. I strongly recommended that she undergo the testing.we discussed the duration of therapy issue with aromatase inhibitors. Patient understands that the current data supports only for 5 years of aromatase inhibitor therapy. We will update breast cancer index to evaluate if she needs longer term antiestrogen therapy.  Surveillance: Patient has an appointment to undergo mammograms. Today's breast exam was normal. Return to clinic in 6 months for follow up.

## 2014-02-18 ENCOUNTER — Telehealth: Payer: Self-pay | Admitting: Hematology and Oncology

## 2014-02-24 ENCOUNTER — Telehealth: Payer: Self-pay

## 2014-02-24 NOTE — Telephone Encounter (Signed)
Request for additional info from Breast Cancer Index completed and faxed.  Sent to scan.

## 2014-03-02 ENCOUNTER — Encounter (HOSPITAL_COMMUNITY): Payer: Self-pay

## 2014-03-03 ENCOUNTER — Other Ambulatory Visit: Payer: Self-pay | Admitting: Hematology and Oncology

## 2014-03-03 DIAGNOSIS — C50912 Malignant neoplasm of unspecified site of left female breast: Secondary | ICD-10-CM

## 2014-03-03 MED ORDER — ANASTROZOLE 1 MG PO TABS: 1.0000 mg | ORAL_TABLET | Freq: Every day | ORAL | Status: DC

## 2014-04-21 ENCOUNTER — Telehealth: Payer: Self-pay

## 2014-04-21 NOTE — Telephone Encounter (Signed)
Results of bone density and mammogram dtd 03/23/14 rcvd from [facility].  Copy to Dr. Lindi Adie.  Sent to scan.

## 2014-07-14 ENCOUNTER — Encounter: Payer: Self-pay | Admitting: Hematology and Oncology

## 2014-08-18 ENCOUNTER — Ambulatory Visit (HOSPITAL_BASED_OUTPATIENT_CLINIC_OR_DEPARTMENT_OTHER): Payer: Medicare Other | Admitting: Hematology and Oncology

## 2014-08-18 VITALS — BP 130/62 | HR 110 | Temp 98.1°F | Resp 19 | Ht 59.0 in | Wt 194.4 lb

## 2014-08-18 DIAGNOSIS — C50812 Malignant neoplasm of overlapping sites of left female breast: Secondary | ICD-10-CM

## 2014-08-18 DIAGNOSIS — C50912 Malignant neoplasm of unspecified site of left female breast: Secondary | ICD-10-CM

## 2014-08-18 DIAGNOSIS — Z170 Estrogen receptor positive status [ER+]: Secondary | ICD-10-CM

## 2014-08-18 DIAGNOSIS — M8580 Other specified disorders of bone density and structure, unspecified site: Secondary | ICD-10-CM | POA: Diagnosis not present

## 2014-08-18 NOTE — Assessment & Plan Note (Signed)
left breast invasive ductal carcinoma stage I, 1.5 cm 2 SLN negative, ER/PR positive HER-2 negative status post lumpectomy, patient refused radiation therapy and has been on Arimidex 1 mg daily since 04/2010   Arimidex toxicities: 1.  Bone density  03/23/2014: T score -1.8 osteopenia recommended calcium and vitamin D  Breast Cancer Surveillance: 1. Breast exam  08/18/2014: Normal 2. Mammogram  03/23/2014 [facility] No abnormalities. Postsurgical changes. Breast Density Category  B. I recommended that she get 3-D mammograms for surveillance. Discussed the differences between different breast density categories.   return to clinic once a year for follow-up    

## 2014-08-18 NOTE — Progress Notes (Signed)
Patient Care Team: Derinda Late, MD as PCP - General  DIAGNOSIS: No matching staging information was found for the patient.  SUMMARY OF ONCOLOGIC HISTORY:   Breast cancer, left breast   03/15/2010 Surgery left Lumpectomy with sentinel node biopsy for Left breast cancer on 03/15/2010. T1 C. N0 ER positive75% PR +10% HER-2/neu negative    Radiation Therapy  patient refuses radiation therapy   04/11/2010 -  Anti-estrogen oral therapy  Arimidex 1 mg daily 5 years    CHIEF COMPLIANT:  Left breast cancer on Arimidex  INTERVAL HISTORY: Tracy Norman is a  74 year old with above-mentioned history of left breast cancer treated with lumpectomy. She refused radiation therapy and is now on oral antiestrogen therapies of January 2012. She had completed over 3 years of therapy. She is tolerating it very well without any major problems or concerns. Denies any hot flashes or myalgias. Denies any lumps or nodules in the breasts.  REVIEW OF SYSTEMS:   Constitutional: Denies fevers, chills or abnormal weight loss Eyes: Denies blurriness of vision Ears, nose, mouth, throat, and face: Denies mucositis or sore throat , acne on the face Respiratory: Denies cough, dyspnea or wheezes Cardiovascular: Denies palpitation, chest discomfort or lower extremity swelling Gastrointestinal:  Denies nausea, heartburn or change in bowel habits Skin: Denies abnormal skin rashes Lymphatics: Denies new lymphadenopathy or easy bruising Neurological:Denies numbness, tingling or new weaknesses , arthritis in the knees uses a cane to walk Behavioral/Psych: Mood is stable, no new changes  Breast:  denies any pain or lumps or nodules in either breasts All other systems were reviewed with the patient and are negative.  I have reviewed the past medical history, past surgical history, social history and family history with the patient and they are unchanged from previous note.  ALLERGIES:  is allergic to  penicillins.  MEDICATIONS:  Current Outpatient Prescriptions  Medication Sig Dispense Refill  . alendronate (FOSAMAX) 70 MG tablet Take 70 mg by mouth once a week. Take with a full glass of water on an empty stomach.    Marland Kitchen amiloride-hydrochlorothiazide (MODURETIC) 5-50 MG tablet     . anastrozole (ARIMIDEX) 1 MG tablet Take 1 tablet (1 mg total) by mouth daily. 90 tablet 3  . aspirin 81 MG tablet Take 81 mg by mouth daily.      . calcium carbonate (OS-CAL) 600 MG TABS Take 600 mg by mouth 2 (two) times daily with a meal.    . Cholecalciferol (VITAMIN D PO) Take 200 mg elemental calcium/kg/hr by mouth.      Marland Kitchen lisinopril (PRINIVIL,ZESTRIL) 40 MG tablet     . potassium chloride (K-DUR) 10 MEQ tablet 2 (two) times daily.     . rosuvastatin (CRESTOR) 20 MG tablet Take 20 mg by mouth daily.     No current facility-administered medications for this visit.    PHYSICAL EXAMINATION: ECOG PERFORMANCE STATUS: 0 - Asymptomatic  Filed Vitals:   08/18/14 1058  BP: 130/62  Pulse: 110  Temp: 98.1 F (36.7 C)  Resp: 19   Filed Weights   08/18/14 1058  Weight: 194 lb 6.4 oz (88.179 kg)    GENERAL:alert, no distress and comfortable SKIN: skin color, texture, turgor are normal, no rashes or significant lesions EYES: normal, Conjunctiva are pink and non-injected, sclera clear OROPHARYNX:no exudate, no erythema and lips, buccal mucosa, and tongue normal  NECK: supple, thyroid normal size, non-tender, without nodularity LYMPH:  no palpable lymphadenopathy in the cervical, axillary or inguinal LUNGS: clear to auscultation and  percussion with normal breathing effort HEART: regular rate & rhythm and no murmurs and no lower extremity edema ABDOMEN:abdomen soft, non-tender and normal bowel sounds Musculoskeletal:no cyanosis of digits and no clubbing  NEURO: alert & oriented x 3 with fluent speech, no focal motor/sensory deficits BREAST: No palpable masses or nodules in either right or left breasts. No  palpable axillary supraclavicular or infraclavicular adenopathy no breast tenderness or nipple discharge. (exam performed in the presence of a chaperone)  LABORATORY DATA:  I have reviewed the data as listed   Chemistry      Component Value Date/Time   NA 142 02/17/2014 1526   NA 140 01/17/2011 1348   K 3.9 02/17/2014 1526   K 4.2 01/17/2011 1348   CL 106 02/26/2012 1326   CL 102 01/17/2011 1348   CO2 29 02/17/2014 1526   CO2 25 01/17/2011 1348   BUN 27.4* 02/17/2014 1526   BUN 25* 01/17/2011 1348   CREATININE 1.3* 02/17/2014 1526   CREATININE 0.90 01/17/2011 1348      Component Value Date/Time   CALCIUM 10.2 02/17/2014 1526   CALCIUM 9.9 01/17/2011 1348   ALKPHOS 64 02/17/2014 1526   ALKPHOS 98 01/17/2011 1348   AST 23 02/17/2014 1526   AST 15 01/17/2011 1348   ALT 21 02/17/2014 1526   ALT <8 01/17/2011 1348   BILITOT 0.41 02/17/2014 1526   BILITOT 0.4 01/17/2011 1348       Lab Results  Component Value Date   WBC 6.9 02/17/2014   HGB 14.6 02/17/2014   HCT 45.0 02/17/2014   MCV 92.5 02/17/2014   PLT 230 02/17/2014   NEUTROABS 4.4 02/17/2014    ASSESSMENT & PLAN:  Breast cancer, left breast left breast invasive ductal carcinoma stage I, 1.5 cm 2 SLN negative, ER/PR positive HER-2 negative status post lumpectomy, patient refused radiation therapy and has been on Arimidex 1 mg daily since 04/2010   Arimidex toxicities: 1.  Bone density  03/23/2014: T score -1.8 osteopenia recommended calcium and vitamin D  Breast Cancer Surveillance: 1. Breast exam  08/18/2014: Normal 2. Mammogram  03/23/2014 [facility] No abnormalities. Postsurgical changes. Breast Density Category  B. I recommended that she get 3-D mammograms for surveillance. Discussed the differences between different breast density categories.  Return to clinic once a year for follow-up.   No orders of the defined types were placed in this encounter.   The patient has a good understanding of the overall plan.  she agrees with it. she will call with any problems that may develop before the next visit here.   Rulon Eisenmenger, MD

## 2015-03-09 ENCOUNTER — Other Ambulatory Visit: Payer: Self-pay | Admitting: Hematology and Oncology

## 2015-03-09 NOTE — Telephone Encounter (Signed)
Chart reviewed.

## 2015-04-08 ENCOUNTER — Encounter: Payer: Self-pay | Admitting: *Deleted

## 2015-04-08 NOTE — Progress Notes (Signed)
Received mammo report from [facility], reviewed by Dr. Lindi Adie, sent to scan.

## 2015-04-29 ENCOUNTER — Encounter: Payer: Self-pay | Admitting: *Deleted

## 2015-04-29 NOTE — Progress Notes (Signed)
Received mammo report from [facility], reviewed by Dr. Lavonda Jumbo to scan.

## 2015-08-16 ENCOUNTER — Encounter: Payer: Self-pay | Admitting: Gastroenterology

## 2015-08-19 ENCOUNTER — Encounter: Payer: Self-pay | Admitting: Hematology and Oncology

## 2015-08-19 ENCOUNTER — Ambulatory Visit (HOSPITAL_BASED_OUTPATIENT_CLINIC_OR_DEPARTMENT_OTHER): Payer: Medicare Other | Admitting: Hematology and Oncology

## 2015-08-19 ENCOUNTER — Telehealth: Payer: Self-pay | Admitting: Hematology and Oncology

## 2015-08-19 VITALS — BP 121/66 | HR 94 | Temp 98.0°F | Resp 18 | Ht 59.0 in | Wt 195.0 lb

## 2015-08-19 DIAGNOSIS — Z170 Estrogen receptor positive status [ER+]: Secondary | ICD-10-CM

## 2015-08-19 DIAGNOSIS — C50412 Malignant neoplasm of upper-outer quadrant of left female breast: Secondary | ICD-10-CM

## 2015-08-19 DIAGNOSIS — M8580 Other specified disorders of bone density and structure, unspecified site: Secondary | ICD-10-CM | POA: Diagnosis not present

## 2015-08-19 NOTE — Telephone Encounter (Signed)
appt made and avs printed °

## 2015-08-19 NOTE — Assessment & Plan Note (Signed)
left breast invasive ductal carcinoma stage I, 1.5 cm 2 SLN negative, ER/PR positive HER-2 negative status post lumpectomy, patient refused radiation therapy and has been on Arimidex 1 mg daily since 04/2010  Arimidex toxicities: 1. Bone density 03/23/2014: T score -1.8 osteopenia recommended calcium and vitamin D I recommended doing breast cancer in [place] determine if she would benefit from extended adjuvant therapy.  Breast Cancer Surveillance: 1. Breast exam 08/19/2015: Normal 2. Mammogram 04/06/2015 [facility] No abnormalities. Postsurgical changes. Breast Density Category B. I recommended that she get 3-D mammograms for surveillance. Discussed the differences between different breast density categories.  Return to clinic once a year for follow-up with survivorship.

## 2015-08-19 NOTE — Progress Notes (Signed)
Patient Care Team: Derinda Late, MD as PCP - General  DIAGNOSIS: Left breast cancer  SUMMARY OF ONCOLOGIC HISTORY:   Breast cancer of upper-outer quadrant of left female breast ([place])   03/15/2010 Surgery left Lumpectomy with sentinel node biopsy for Left breast cancer on 03/15/2010. T1 C. N0 ER positive75% PR +10% HER-2/neu negative    Radiation Therapy  patient refused radiation therapy   04/11/2010 - 08/19/2015 Anti-estrogen oral therapy  Arimidex 1 mg daily 5 years    CHIEF COMPLIANT: Follow-up on Arimidex  INTERVAL HISTORY: Tracy Norman is a 75 year old above-mentioned history of left breast cancer stage IA disease who underwent lumpectomy followed by 5 years of Arimidex therapy. She is here today to discuss whether she needs to stay on this medicine any longer. She has had osteopenia but otherwise has had no major medical illnesses related to Arimidex therapy. She denies any lumps or nodules in the breasts. She did have a mammogram December which showed suspicious calcifications and she will have another mammogram in July.  REVIEW OF SYSTEMS:   Constitutional: Denies fevers, chills or abnormal weight loss Eyes: Denies blurriness of vision Ears, nose, mouth, throat, and face: Denies mucositis or sore throat Respiratory: Denies cough, dyspnea or wheezes Cardiovascular: Denies palpitation, chest discomfort Gastrointestinal:  Denies nausea, heartburn or change in bowel habits Skin: Denies abnormal skin rashes Lymphatics: Denies new lymphadenopathy or easy bruising Neurological:Denies numbness, tingling or new weaknesses Behavioral/Psych: Mood is stable, no new changes  Extremities: No lower extremity edema Breast:  denies any pain or lumps or nodules in either breasts All other systems were reviewed with the patient and are negative.  I have reviewed the past medical history, past surgical history, social history and family history with the patient and they are unchanged from  previous note.  ALLERGIES:  is allergic to penicillins.  MEDICATIONS:  Current Outpatient Prescriptions  Medication Sig Dispense Refill  . alendronate (FOSAMAX) 70 MG tablet Take 70 mg by mouth once a week. Take with a full glass of water on an empty stomach.    Marland Kitchen amiloride-hydrochlorothiazide (MODURETIC) 5-50 MG tablet     . aspirin 81 MG tablet Take 81 mg by mouth daily.      . calcium carbonate (OS-CAL) 600 MG TABS Take 600 mg by mouth 2 (two) times daily with a meal.    . Cholecalciferol (VITAMIN D PO) Take 200 mg elemental calcium/kg/hr by mouth.      Marland Kitchen lisinopril (PRINIVIL,ZESTRIL) 40 MG tablet     . potassium chloride (K-DUR) 10 MEQ tablet 2 (two) times daily.     . rosuvastatin (CRESTOR) 20 MG tablet Take 20 mg by mouth daily.     No current facility-administered medications for this visit.    PHYSICAL EXAMINATION: ECOG PERFORMANCE STATUS: 1 - Symptomatic but completely ambulatory  Filed Vitals:   08/19/15 1112  BP: 121/66  Pulse: 94  Temp: 98 F (36.7 C)  Resp: 18   Filed Weights   08/19/15 1112  Weight: 195 lb (88.451 kg)    GENERAL:alert, no distress and comfortable SKIN: skin color, texture, turgor are normal, no rashes or significant lesions EYES: normal, Conjunctiva are pink and non-injected, sclera clear OROPHARYNX:no exudate, no erythema and lips, buccal mucosa, and tongue normal  NECK: supple, thyroid normal size, non-tender, without nodularity LYMPH:  no palpable lymphadenopathy in the cervical, axillary or inguinal LUNGS: clear to auscultation and percussion with normal breathing effort HEART: regular rate & rhythm and no murmurs and no  lower extremity edema ABDOMEN:abdomen soft, non-tender and normal bowel sounds MUSCULOSKELETAL:no cyanosis of digits and no clubbing  NEURO: alert & oriented x 3 with fluent speech, no focal motor/sensory deficits EXTREMITIES: No lower extremity edema BREAST: No palpable masses or nodules in either right or left  breasts. No palpable axillary supraclavicular or infraclavicular adenopathy no breast tenderness or nipple discharge. (exam performed in the presence of a chaperone)  LABORATORY DATA:  I have reviewed the data as listed   Chemistry      Component Value Date/Time   NA 142 02/17/2014 1526   NA 140 01/17/2011 1348   K 3.9 02/17/2014 1526   K 4.2 01/17/2011 1348   CL 106 02/26/2012 1326   CL 102 01/17/2011 1348   CO2 29 02/17/2014 1526   CO2 25 01/17/2011 1348   BUN 27.4* 02/17/2014 1526   BUN 25* 01/17/2011 1348   CREATININE 1.3* 02/17/2014 1526   CREATININE 0.90 01/17/2011 1348      Component Value Date/Time   CALCIUM 10.2 02/17/2014 1526   CALCIUM 9.9 01/17/2011 1348   ALKPHOS 64 02/17/2014 1526   ALKPHOS 98 01/17/2011 1348   AST 23 02/17/2014 1526   AST 15 01/17/2011 1348   ALT 21 02/17/2014 1526   ALT <8 01/17/2011 1348   BILITOT 0.41 02/17/2014 1526   BILITOT 0.4 01/17/2011 1348      Lab Results  Component Value Date   WBC 6.9 02/17/2014   HGB 14.6 02/17/2014   HCT 45.0 02/17/2014   MCV 92.5 02/17/2014   PLT 230 02/17/2014   NEUTROABS 4.4 02/17/2014   ASSESSMENT & PLAN:  Breast cancer of upper-outer quadrant of left female breast ([place]) left breast invasive ductal carcinoma stage I, 1.5 cm 2 SLN negative, ER/PR positive HER-2 negative status post lumpectomy, patient refused radiation therapy and has been on Arimidex 1 mg daily since 04/2010  Arimidex toxicities: 1. Bone density 03/23/2014: T score -1.8 osteopenia recommended calcium and vitamin D I recommended doing breast cancer in [place] determine if she would benefit from extended adjuvant therapy.  Breast Cancer Surveillance: 1. Breast exam 08/19/2015: Normal 2. Mammogram 04/06/2015 [facility] No abnormalities. Postsurgical changes. Breast Density Category B. I recommended that she get 3-D mammograms for surveillance. Discussed the differences between different breast density categories.  Return to clinic  once a year for follow-up with survivorship.   No orders of the defined types were placed in this encounter.   The patient has a good understanding of the overall plan. she agrees with it. she will call with any problems that may develop before the next visit here.   Rulon Eisenmenger, MD 08/19/2015

## 2016-03-29 ENCOUNTER — Other Ambulatory Visit: Payer: Self-pay | Admitting: Family Medicine

## 2016-03-29 DIAGNOSIS — R471 Dysarthria and anarthria: Secondary | ICD-10-CM

## 2016-04-12 ENCOUNTER — Ambulatory Visit
Admission: RE | Admit: 2016-04-12 | Discharge: 2016-04-12 | Disposition: A | Discharge status: Home / Self Care | Payer: Medicare Other | Source: Ambulatory Visit | Attending: Family Medicine | Admitting: Family Medicine

## 2016-04-12 DIAGNOSIS — R471 Dysarthria and anarthria: Secondary | ICD-10-CM

## 2016-04-12 MED ORDER — GADOBENATE DIMEGLUMINE 529 MG/ML IV SOLN
9.0000 mL | Freq: Once | INTRAVENOUS | Status: AC | PRN
  Administered 2016-04-12: 9 mL via INTRAVENOUS

## 2016-04-19 ENCOUNTER — Other Ambulatory Visit: Payer: Self-pay | Admitting: *Deleted

## 2016-04-19 DIAGNOSIS — I739 Peripheral vascular disease, unspecified: Secondary | ICD-10-CM

## 2016-04-20 ENCOUNTER — Ambulatory Visit (HOSPITAL_COMMUNITY)
Admission: RE | Admit: 2016-04-20 | Discharge: 2016-04-20 | Disposition: A | Discharge status: Home / Self Care | Payer: Medicare Other | Source: Ambulatory Visit | Attending: Vascular Surgery | Admitting: Vascular Surgery

## 2016-04-20 ENCOUNTER — Ambulatory Visit (INDEPENDENT_AMBULATORY_CARE_PROVIDER_SITE_OTHER)
Admission: RE | Admit: 2016-04-20 | Discharge: 2016-04-20 | Disposition: A | Discharge status: Home / Self Care | Payer: Medicare Other | Source: Ambulatory Visit | Attending: Vascular Surgery | Admitting: Vascular Surgery

## 2016-04-20 DIAGNOSIS — I739 Peripheral vascular disease, unspecified: Secondary | ICD-10-CM

## 2016-04-25 ENCOUNTER — Encounter: Payer: Self-pay | Admitting: Vascular Surgery

## 2016-04-28 ENCOUNTER — Encounter: Payer: Medicare Other | Admitting: Vascular Surgery

## 2016-05-18 ENCOUNTER — Encounter: Payer: Self-pay | Admitting: Vascular Surgery

## 2016-05-24 ENCOUNTER — Encounter: Payer: Self-pay | Admitting: Vascular Surgery

## 2016-05-24 ENCOUNTER — Ambulatory Visit (INDEPENDENT_AMBULATORY_CARE_PROVIDER_SITE_OTHER): Payer: Medicare Other | Admitting: Vascular Surgery

## 2016-05-24 VITALS — BP 154/72 | HR 95 | Temp 97.0°F | Resp 18 | Ht 59.0 in | Wt 187.6 lb

## 2016-05-24 DIAGNOSIS — I739 Peripheral vascular disease, unspecified: Secondary | ICD-10-CM | POA: Diagnosis not present

## 2016-05-24 NOTE — Progress Notes (Signed)
Patient name: Tracy Norman MRN: [id number] DOB: 1940-06-23 Sex: female  REASON FOR CONSULT: Peripheral vascular disease. Here for clearance prior to knee surgery. Referred by Dr. Sandi Mariscal.  HPI: Tracy Norman is a 76 y.o. female, who was considering knee replacement surgery and therefore she sent for vascular consultation given her known peripheral vascular disease. She has a long history of osteoarthritis of both knees which has been more significant on the left side. She is followed by Dr. Sharol Given. She does describe some claudication in both legs which is brought on by ambulation and relieved with rest. The symptoms involve her calves and do not involve her thighs her hips. The symptoms have been stable. She denies any history of rest pain or history of nonhealing ulcers.  She is not a smoker. Her risk factors for peripheral vascular disease include hypertension and a remote history of tobacco use. She quit in 1980. She denies any history of diabetes, hypercholesterolemia. She does have a family history of premature cardiovascular disease in that her grandfather died at age 92 from a heart attack.  I have reviewed the records that were sent with the patient. The patient does have severe osteoarthritis of both knees which is worse on the left side. She has history of hypertension which has been under good control. She also has mixed hyperlipidemia. She has a history of breast cancer in the past. Her creatinine in December 2017 was 1.04 with a GFR of 60.  Past Medical History:  Diagnosis Date  . Arthritis   . Brain tumor ([place])   . Cancer ([place])   . Hypertension     Family History  Problem Relation Age of Onset  . Cancer Father   . Heart disease Father     before age 51    SOCIAL HISTORY: Social History   Social History  . Marital status: Widowed    Spouse name: N/A  . Number of children: N/A  . Years of education: N/A   Occupational History  . Not on file.   Social  History Main Topics  . Smoking status: Former Smoker    Quit date: 09/25/1978  . Smokeless tobacco: Never Used  . Alcohol use Yes     Comment: occasional  . Drug use: No  . Sexual activity: Not on file   Other Topics Concern  . Not on file   Social History Narrative  . No narrative on file    Allergies  Allergen Reactions  . Penicillins Hives    All over the body.    Current Outpatient Prescriptions  Medication Sig Dispense Refill  . alendronate (FOSAMAX) 70 MG tablet Take 70 mg by mouth once a week. Take with a full glass of water on an empty stomach.    Marland Kitchen aspirin 81 MG tablet Take 81 mg by mouth daily.      . calcium carbonate (OS-CAL) 600 MG TABS Take 600 mg by mouth 2 (two) times daily with a meal.    . Cholecalciferol (VITAMIN D PO) Take 200 mg elemental calcium/kg/hr by mouth.      Marland Kitchen lisinopril (PRINIVIL,ZESTRIL) 40 MG tablet     . potassium chloride (K-DUR) 10 MEQ tablet 3 (three) times daily.     . rosuvastatin (CRESTOR) 20 MG tablet Take 20 mg by mouth daily.    Marland Kitchen triamterene-hydrochlorothiazide (MAXZIDE) 75-50 MG tablet     . amiloride-hydrochlorothiazide (MODURETIC) 5-50 MG tablet      No current facility-administered medications for this  visit.     REVIEW OF SYSTEMS:  [X]  denotes positive finding, [ ]  denotes negative finding Cardiac  Comments:  Chest pain or chest pressure:    Shortness of breath upon exertion:    Short of breath when lying flat:    Irregular heart rhythm:        Vascular    Pain in calf, thigh, or hip brought on by ambulation: X   Pain in feet at night that wakes you up from your sleep:     Blood clot in your veins:    Leg swelling:  X       Pulmonary    Oxygen at home:    Productive cough:     Wheezing:         Neurologic    Sudden weakness in arms or legs:     Sudden numbness in arms or legs:     Sudden onset of difficulty speaking or slurred speech: X   Temporary loss of vision in one eye:     Problems with dizziness:          Gastrointestinal    Blood in stool:     Vomited blood:         Genitourinary    Burning when urinating:     Blood in urine:        Psychiatric    Major depression:         Hematologic    Bleeding problems:    Problems with blood clotting too easily:        Skin    Rashes or ulcers: X       Constitutional    Fever or chills:      PHYSICAL EXAM: Vitals:   05/24/16 1008 05/24/16 1010  BP: (!) 150/79 (!) 154/72  Pulse: 95   Resp: 18   Temp: 97 F (36.1 C)   TempSrc: Oral   SpO2: 98%   Weight: 187 lb 9.6 oz (85.1 kg)   Height: 4\' 11"  (1.499 m)     GENERAL: The patient is a well-nourished female, in no acute distress. The vital signs are documented above. CARDIAC: There is a regular rate and rhythm.  VASCULAR: I do not detect carotid bruits. She has palpable femoral pulses. I cannot palpate popliteal or pedal pulses. She has mild bilateral lower extremity swelling. PULMONARY: There is good air exchange bilaterally without wheezing or rales. ABDOMEN: Soft and non-tender with normal pitched bowel sounds.  MUSCULOSKELETAL: There are no major deformities or cyanosis. NEUROLOGIC: No focal weakness or paresthesias are detected. SKIN: There are no ulcers or rashes noted. She has some small spider veins bilaterally. PSYCHIATRIC: The patient has a normal affect.  DATA:   LOWER EXTREMITY ARTERIAL DOPPLER STUDY: I have reviewed his lower extremity arterial Doppler study that was done on 04/20/2016.   He had monophasic Doppler signals in the right posterior tibial and dorsalis pedis position with an ABI of 59%. Toe pressure on the right was 68 mmHg.  On the left side, he had a monophasic posterior tibial signal and a biphasic dorsalis pedis signal. ABI was 78% on the left with a toe pressure of 136 mmHg.  BILATERAL LOWER EXTREMITY DUPLEX: I have reviewed his bilateral lower extremity duplex scan that was done on 04/20/2016.  On the right side there is a significant  stenosis in the distal right superficial femoral artery and also popliteal artery.  There is no significant stenosis visualized in the left lower extremity although  he may have some tibial artery disease.  MEDICAL ISSUES:  PERIPHERAL VASCULAR DISEASE: Based on her exam and noninvasive studies, I think she has adequate circulation to heal a total knee replacement on the left if she decides to proceed with that. Her ABIs 78%. Her toe pressures are 136 mmHg, and her duplex does not show any significant occlusive disease except for possibly some tibial artery occlusive disease.  If she subsequently were to require a knee replacement on the right, I think she would benefit from arteriography given that the circulation is not as good on the right with an ABI 59% and a toe pressure of 68 mmHg. She also has disease of her superficial femoral artery and popliteal artery on the right. However given that she is a long ways away from having a knee replacement on the right I do not think that an arteriogram is indicated at this time. I have recommended water aerobics for her so that she can remain active. We've also discussed the importance of nutrition. I'll be happy to see her back at anytime if any new vascular issues arise.  Of note, the patient also had an MRI of the brain which showed a large meningioma and some old lacunar infarcts. I would have to defer any recommendations concerning this to neurology or neurosurgery.  Deitra Mayo [facility] [phone]

## 2016-05-25 ENCOUNTER — Encounter: Payer: Self-pay | Admitting: Family Medicine

## 2016-05-31 ENCOUNTER — Encounter: Payer: Self-pay | Admitting: Gastroenterology

## 2016-06-06 ENCOUNTER — Encounter: Payer: Self-pay | Admitting: Neurology

## 2016-06-06 ENCOUNTER — Ambulatory Visit (INDEPENDENT_AMBULATORY_CARE_PROVIDER_SITE_OTHER): Payer: Medicare Other | Admitting: Neurology

## 2016-06-06 VITALS — BP 170/75 | HR 85 | Ht 60.0 in | Wt 191.8 lb

## 2016-06-06 DIAGNOSIS — I639 Cerebral infarction, unspecified: Secondary | ICD-10-CM | POA: Diagnosis not present

## 2016-06-06 DIAGNOSIS — D329 Benign neoplasm of meninges, unspecified: Secondary | ICD-10-CM | POA: Diagnosis not present

## 2016-06-06 DIAGNOSIS — I6381 Other cerebral infarction due to occlusion or stenosis of small artery: Secondary | ICD-10-CM

## 2016-06-06 NOTE — Patient Instructions (Signed)
I had a long d/w patient about her recent lacunar stroke, risk for recurrent stroke/TIAs, personally independently reviewed imaging studies and stroke evaluation results and answered questions.Continue Plavix  for secondary stroke prevention and maintain strict control of hypertension with blood pressure goal below 130/90, diabetes with hemoglobin A1c goal below 6.5% and lipids with LDL cholesterol goal below 70 mg/dL. I also advised the patient to eat a healthy diet with plenty of whole grains, cereals, fruits and vegetables, exercise regularly and maintain ideal body weight. Continue conservative follow-up for her 8 mm meningioma for which I do not feel she needs surgery at this time. Followup in the future with me in 2 months or call earlier if necessary Stroke Prevention Some medical conditions and behaviors are associated with an increased chance of having a stroke. You may prevent a stroke by making healthy choices and managing medical conditions. How can I reduce my risk of having a stroke?  Stay physically active. Get at least 30 minutes of activity on most or all days.  Do not smoke. It may also be helpful to avoid exposure to secondhand smoke.  Limit alcohol use. Moderate alcohol use is considered to be:  No more than 2 drinks per day for men.  No more than 1 drink per day for nonpregnant women.  Eat healthy foods. This involves:  Eating 5 or more servings of fruits and vegetables a day.  Making dietary changes that address high blood pressure (hypertension), high cholesterol, diabetes, or obesity.  Manage your cholesterol levels.  Making food choices that are high in fiber and low in saturated fat, trans fat, and cholesterol may control cholesterol levels.  Take any prescribed medicines to control cholesterol as directed by your health care provider.  Manage your diabetes.  Controlling your carbohydrate and sugar intake is recommended to manage diabetes.  Take any prescribed  medicines to control diabetes as directed by your health care provider.  Control your hypertension.  Making food choices that are low in salt (sodium), saturated fat, trans fat, and cholesterol is recommended to manage hypertension.  Ask your health care provider if you need treatment to lower your blood pressure. Take any prescribed medicines to control hypertension as directed by your health care provider.  If you are 28-62 years of age, have your blood pressure checked every 3-5 years. If you are 46 years of age or older, have your blood pressure checked every year.  Maintain a healthy weight.  Reducing calorie intake and making food choices that are low in sodium, saturated fat, trans fat, and cholesterol are recommended to manage weight.  Stop drug abuse.  Avoid taking birth control pills.  Talk to your health care provider about the risks of taking birth control pills if you are over 42 years old, smoke, get migraines, or have ever had a blood clot.  Get evaluated for sleep disorders (sleep apnea).  Talk to your health care provider about getting a sleep evaluation if you snore a lot or have excessive sleepiness.  Take medicines only as directed by your health care provider.  For some people, aspirin or blood thinners (anticoagulants) are helpful in reducing the risk of forming abnormal blood clots that can lead to stroke. If you have the irregular heart rhythm of atrial fibrillation, you should be on a blood thinner unless there is a good reason you cannot take them.  Understand all your medicine instructions.  Make sure that other conditions (such as anemia or atherosclerosis) are addressed. Get  help right away if:  You have sudden weakness or numbness of the face, arm, or leg, especially on one side of the body.  Your face or eyelid droops to one side.  You have sudden confusion.  You have trouble speaking (aphasia) or understanding.  You have sudden trouble seeing  in one or both eyes.  You have sudden trouble walking.  You have dizziness.  You have a loss of balance or coordination.  You have a sudden, severe headache with no known cause.  You have new chest pain or an irregular heartbeat. Any of these symptoms may represent a serious problem that is an emergency. Do not wait to see if the symptoms will go away. Get medical help at once. Call your local emergency services (911 in [place] Do not drive yourself to the hospital. This information is not intended to replace advice given to you by your health care provider. Make sure you discuss any questions you have with your health care provider. Document Released: 05/04/2004 Document Revised: 09/02/2015 Document Reviewed: 09/27/2012 Elsevier Interactive Patient Education  2017 [employer].

## 2016-06-06 NOTE — Progress Notes (Signed)
[facility] [address] [place]. [place] [address] [phone]       OFFICE CONSULT NOTE  Tracy. Tracy Norman Date of Birth:  1941-02-22 Medical Record Number:  [id number]   Referring MD:  Derinda Late Reason for Referral:  Stroke  HPI: Tracy Norman is a 69 year pleasant Caucasian lady who states that in November 2017 she developed sudden onset of speech difficulties, slurred speech and right hand weakness and clumsiness. This lasted for couple of days. She did not seek immediate medical help. She has had transient worsening of the same symptoms on a number of occasions since then but is unable to exactly pinpoint and tell me that triggers. She saw her primary physician Dr. 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 Incidental small 8 x 8 mm left frontal parasagittal as well as a smaller 4 mm or anterior meningiomas are noted. Patient was referred to neurosurgeon Dr. home she saw advised no surgical treatment at the present time. Patient states she recently had labwork done in the primary physician's office and  Lipid profile was okay but I do not have those results to look at. She was previously on aspirin which was changed recently to Plavix which is starting well without bleeding but does get minor bruising. She is tolerating Lipitor well without muscle aches and pains. She states her blood pressure is usually well controlled though it is elevated today at 170/75 in office. She has mild gait difficulties but she blames this on her osteoarthritis for which she has been refusing surgery. She has not had any further stroke related outpatient workup done including echo echocardiogram and Doppler studies. She denies any known prior history of strokes TIAs, significant head injury, loss of consciousness, seizures or migraines.. He  does not smoke or drink. Is not able to be physically active due to her osteoarthritis and knee pain  ROS:   14 system review of systems is positive for  leg swelling, skin itching, moles cough, easy bruising, joint pain, cramps, slurred speech and all other systems negative  PMH:  Past Medical History:  Diagnosis Date  . Arthritis   . Brain tumor ([place])   . Cancer ([place])   . Cataracts, bilateral   . Dysarthria   . Eczema   . Hyperlipidemia   . Hypertension   . Liver disease   . Osteopenia   . Venous insufficiency   . Vision abnormalities     Social History:  Social History   Social History  . Marital status: Widowed    Spouse name: N/A  . Number of children: N/A  . Years of education: N/A   Occupational History  . Not on file.   Social History Main Topics  . Smoking status: Former Smoker    Quit date: 09/25/1978  . Smokeless tobacco: Never Used  . Alcohol use 0.6 oz/week    1 Glasses of wine per week     Comment: occasional  . Drug use: No  . Sexual activity: Not on file   Other Topics Concern  . Not on file   Social History Narrative  . No narrative on file    Medications:   Current Outpatient Prescriptions on File Prior to Visit  Medication Sig Dispense Refill  . alendronate (FOSAMAX) 70 MG tablet Take 70 mg by mouth once a week. Take  with a full glass of water on an empty stomach.    Marland Kitchen amiloride-hydrochlorothiazide (MODURETIC) 5-50 MG tablet     . calcium carbonate (OS-CAL) 600 MG TABS Take 600 mg by mouth 2 (two) times daily with a meal.    . Cholecalciferol (VITAMIN D PO) Take 200 mg elemental calcium/kg/hr by mouth.      Marland Kitchen lisinopril (PRINIVIL,ZESTRIL) 40 MG tablet     . potassium chloride (K-DUR) 10 MEQ tablet 2 (two) times daily.     . rosuvastatin (CRESTOR) 20 MG tablet Take 20 mg by mouth daily.    Marland Kitchen triamterene-hydrochlorothiazide (MAXZIDE) 75-50 MG tablet      No current facility-administered medications on file prior to visit.      Allergies:   Allergies  Allergen Reactions  . Penicillins Hives    All over the body.    Physical Exam General: Obese elderly Caucasian lady seated, in no evident distress Head: head normocephalic and atraumatic.   Neck: supple with no carotid or supraclavicular bruits Cardiovascular: regular rate and rhythm, no murmurs Musculoskeletal: no deformity Skin:  no rash/petichiae Vascular:  Normal pulses all extremities  Neurologic Exam Mental Status: Awake and fully alert. Oriented to place and time. Recent and remote memory intact. Attention span, concentration and fund of knowledge appropriate. Mood and affect appropriate.  Cranial Nerves: Fundoscopic exam reveals sharp disc margins. Pupils equal, briskly reactive to light. Extraocular movements full without nystagmus. Visual fields full to confrontation. Hearing intact. Facial sensation intact. Face, tongue, palate moves normally and symmetrically.  Motor: Normal bulk and tone. Normal strength in all tested extremity muscles.Diminished fine finger movements on the right. Orbits left-to-right approximately. Minimum right grip weakness. Sensory.: intact to touch , pinprick , position and vibratory sensation.  Coordination: Rapid alternating movements normal in all extremities. Finger-to-nose and heel-to-shin performed accurately bilaterally. Gait and Station: Arises from chair with  difficulty. Stance is stooped. Gait demonstrates normal stride length and balance but favors left leg due to knee pain. . Unable to heel, toe and tandem walk without difficulty.  Reflexes: 1+ and symmetric. Toes downgoing.   NIHSS  0 Modified Rankin  1  ASSESSMENT:  41 year Caucasian lady with transient speech difficulties and right hand weakness in November 2017 likely due to right pontine lacunar infarct from small vessel disease. Incidental to small parasagittal meningiomas. Vascular risk factors of hypertension hyperlipidemia obesity and  age   PLAN: I had a long d/w patient about her recent lacunar stroke, risk for recurrent stroke/TIAs, personally independently reviewed imaging studies and stroke evaluation results and answered questions.Continue Plavix  for secondary stroke prevention and maintain strict control of hypertension with blood pressure goal below 130/90, diabetes with hemoglobin A1c goal below 6.5% and lipids with LDL cholesterol goal below 70 mg/dL. I also advised the patient to eat a healthy diet with plenty of whole grains, cereals, fruits and vegetables, exercise regularly and maintain ideal body weight. Continue conservative follow-up for her 8 mm meningioma for which I do not feel she needs surgery at this time. Followup in the future with me in 2 months or call earlier if necessary Antony Contras, MD  [facility] [address] [place], [place] [address]  Phone [phone] Fax [phone] Note: This document was prepared with digital dictation and possible smart phrase technology. Any transcriptional errors that result from this process are unintentional.

## 2016-06-20 ENCOUNTER — Ambulatory Visit: Payer: Self-pay | Admitting: Neurology

## 2016-06-22 ENCOUNTER — Ambulatory Visit (INDEPENDENT_AMBULATORY_CARE_PROVIDER_SITE_OTHER): Payer: Medicare Other

## 2016-06-22 DIAGNOSIS — I639 Cerebral infarction, unspecified: Secondary | ICD-10-CM | POA: Diagnosis not present

## 2016-06-22 DIAGNOSIS — I6381 Other cerebral infarction due to occlusion or stenosis of small artery: Secondary | ICD-10-CM

## 2016-06-22 DIAGNOSIS — Z0289 Encounter for other administrative examinations: Secondary | ICD-10-CM

## 2016-07-10 ENCOUNTER — Telehealth: Payer: Self-pay

## 2016-07-10 ENCOUNTER — Encounter: Payer: Self-pay | Admitting: Gastroenterology

## 2016-07-10 ENCOUNTER — Ambulatory Visit (INDEPENDENT_AMBULATORY_CARE_PROVIDER_SITE_OTHER): Payer: Medicare Other | Admitting: Gastroenterology

## 2016-07-10 VITALS — BP 118/68 | HR 112 | Ht 59.5 in | Wt 188.1 lb

## 2016-07-10 DIAGNOSIS — Z1212 Encounter for screening for malignant neoplasm of rectum: Secondary | ICD-10-CM | POA: Diagnosis not present

## 2016-07-10 DIAGNOSIS — I639 Cerebral infarction, unspecified: Secondary | ICD-10-CM

## 2016-07-10 DIAGNOSIS — Z7902 Long term (current) use of antithrombotics/antiplatelets: Secondary | ICD-10-CM

## 2016-07-10 DIAGNOSIS — Z1211 Encounter for screening for malignant neoplasm of colon: Secondary | ICD-10-CM

## 2016-07-10 MED ORDER — NA SULFATE-K SULFATE-MG SULF 17.5-3.13-1.6 GM/177ML PO SOLN: 1.0000 | Freq: Once | ORAL | 0 refills | Status: AC

## 2016-07-10 NOTE — Progress Notes (Signed)
History of Present Illness: This is a 76 year old female referred by Derinda Late, MD for [facility] screening. She recently underwent colonoscopy in July 2017 showing 2 small hyperplastic polyps and internal hemorrhoids. 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 She is followed by Dr. Leonie Man and Dr. Sherwood Gambler. No gastrointestinal complaints. Denies weight loss, abdominal pain, constipation, diarrhea, change in stool caliber, melena, hematochezia, nausea, vomiting, dysphagia, reflux symptoms, chest pain.  Allergies  Allergen Reactions  . Penicillins Hives    All over the body.   Outpatient Medications Prior to Visit  Medication Sig Dispense Refill  . alendronate (FOSAMAX) 70 MG tablet Take 70 mg by mouth once a week. Take with a full glass of water on an empty stomach.    . calcium carbonate (OS-CAL) 600 MG TABS Take 600 mg by mouth 2 (two) times daily with a meal.    . Cholecalciferol (VITAMIN D PO) Take 800 Units by mouth 2 (two) times daily.     . clopidogrel (PLAVIX) 75 MG tablet     . lisinopril (PRINIVIL,ZESTRIL) 40 MG tablet     . potassium chloride (K-DUR) 10 MEQ tablet Take 30 mEq by mouth 2 (two) times daily.     . rosuvastatin (CRESTOR) 20 MG tablet Take 20 mg by mouth daily.    Marland Kitchen triamterene-hydrochlorothiazide (MAXZIDE) 75-50 MG tablet     . amiloride-hydrochlorothiazide (MODURETIC) 5-50 MG tablet      No facility-administered medications prior to visit.    Past Medical History:  Diagnosis Date  . Arthritis   . Brain tumor ([place])   . Breast cancer ([place])   . Cataracts, bilateral   . Cervical cancer ([place])   . Colon polyps   . Dysarthria   . Eczema   . Hyperlipidemia   . Hypertension   . Liver disease   . Osteopenia   . Peripheral vascular disease ([place])   . Stroke ([place])   . Venous insufficiency   . Vision abnormalities    Past Surgical History:  Procedure Laterality Date  . BREAST LUMPECTOMY  Left 03/15/2010  . CATARACT EXTRACTION W/ INTRAOCULAR LENS  IMPLANT, BILATERAL Bilateral   . CERVICAL CONIZATION W/BX    . TONSILLECTOMY     Social History   Social History  . Marital status: Widowed    Spouse name: N/A  . Number of children: 0  . Years of education: N/A   Occupational History  . retired    Social History Main Topics  . Smoking status: Former Smoker    Quit date: 09/25/1978  . Smokeless tobacco: Never Used  . Alcohol use 0.6 oz/week    1 Glasses of wine per week     Comment: occasional  . Drug use: No  . Sexual activity: Not Asked   Other Topics Concern  . None   Social History Narrative  . None   Family History  Problem Relation Age of Onset  . Hypertension Mother   . Osteoarthritis Mother   . Heart disease Father     before age 74  . Esophageal cancer Father     mets  . Hypertension Father   . Stroke Brother   . Heart attack Brother   . Hypertension Brother   . Hypertension Brother   . Other Paternal Grandmother     hardening of the arteries  . Heart attack Paternal Grandfather 65     Review of  Systems: Pertinent positive and negative review of systems were noted in the above HPI section. All other review of systems were otherwise negative.   Physical Exam: General: Well developed, well nourished, no acute distress Head: Normocephalic and atraumatic Eyes:  sclerae anicteric, EOMI Ears: Normal auditory acuity Mouth: No deformity or lesions Neck: Supple, no masses or thyromegaly Lungs: Clear throughout to auscultation Heart: Regular rate and rhythm; no murmurs, rubs or bruits Abdomen: Soft, non tender and non distended. No masses, hepatosplenomegaly or hernias noted. Normal Bowel sounds Rectal: deferred to colonoscopy Musculoskeletal: Symmetrical with no gross deformities  Skin: No lesions on visible extremities Pulses:  Normal pulses noted Extremities: No clubbing, cyanosis, edema or deformities noted Neurological: Alert oriented x  4, grossly nonfocal Cervical Nodes:  No significant cervical adenopathy Inguinal Nodes: No significant inguinal adenopathy Psychological:  Alert and cooperative. Normal mood and affect  Assessment and Recommendations:  1. CRC screening, average risk. The risks (including bleeding, perforation, infection, missed lesions, medication reactions and possible hospitalization or surgery if complications occur), benefits, and alternatives to colonoscopy with possible biopsy and possible polypectomy were discussed with the patient and they consent to proceed.   2. History of CVA. Hold Plavix 5 days before procedure - will instruct when and how to resume after procedure. Low but real risk of cardiovascular event such as heart attack, stroke, embolism, thrombosis or ischemia/infarct of other organs off Plavix explained and need to seek urgent help if this occurs. The patient consents to proceed. Will communicate by phone or EMR with patient's prescribing provider to confirm that holding Plavix is reasonable in this case.    cc: Derinda Late, MD [address] [place], [place] [address]

## 2016-07-10 NOTE — Telephone Encounter (Signed)
.   I have reviewed the patient's chart her last stroke/TIA was in November 2017 and has been more than 3 months hence she is outside the maximum risk. period. The patient may hold Plavix for 5 days prior to the colonoscopy and resume it after the colonoscopy when safe with a small but acceptable peri-procedure  risk for TIA/stroke if she is willing

## 2016-07-10 NOTE — Patient Instructions (Signed)
You have been scheduled for a colonoscopy. Please follow written instructions given to you at your visit today.  Please pick up your prep supplies at the pharmacy within the next 1-3 days. If you use inhalers (even only as needed), please bring them with you on the day of your procedure. Your physician has requested that you go to www.startemmi.com and enter the access code given to you at your visit today. This web site gives a general overview about your procedure. However, you should still follow specific instructions given to you by our office regarding your preparation for the procedure.  Normal BMI (Body Mass Index- based on height and weight) is between 23 and 30. Your BMI today is Body mass index is 37.36 kg/m. Marland Kitchen Please consider follow up  regarding your BMI with your Primary Care Provider.  Thank you for choosing me and [facility].  Pricilla Riffle. Dagoberto Ligas., MD., Marval Regal

## 2016-07-10 NOTE — Telephone Encounter (Signed)
   JAELYNNE HOCKLEY 16-Jan-1941 [id number]  Dear Dr. Leonie Man:  We have scheduled the above named patient for a(n) Colonoscopy procedure. Our records show that (s)he is on anticoagulation therapy.  Please advise as to whether the patient may come of their therapy of Plavix 5 days prior to their procedure which is scheduled for 08/22/16.  Please route your response to Marlon Pel, CMA or fax response to [phone].  Sincerely,    [facility]

## 2016-07-11 NOTE — Telephone Encounter (Signed)
Patient informed to hold Plavix 5 days prior to Colonoscopy per Dr. Leonie Man. Patient verbalized understanding.

## 2016-07-21 ENCOUNTER — Telehealth: Payer: Self-pay

## 2016-07-21 NOTE — Telephone Encounter (Signed)
Notes recorded by Marval Regal, RN on 07/21/2016 at 11:10 AM EDT Rn call patient about her transcranial doppler study. Rn stated per Dr. Leonie Man the study was limited due to thick skull, but was normal. The thick skull is her anatomy nothing to worry about. Mild hardening of the arteries which is age appropriate. PT verbalized understanding.

## 2016-07-21 NOTE — Telephone Encounter (Signed)
-----   Message from Garvin Fila, MD sent at 07/19/2016  8:56 PM EDT ----- Tracy Norman inform the patient that transcranial doppler study was limited dur to thick skull but was normal. Mild hardening of arteries which is age appropriate

## 2016-07-21 NOTE — Telephone Encounter (Signed)
Notes recorded by Marval Regal, RN on 07/21/2016 at 11:02 AM EDT Rn call patient that the carotid doppler showed no significant blockages, only mild age related hardening of the blood vessels nothing to worry about. Pt verbalized understanding. ------

## 2016-07-26 ENCOUNTER — Telehealth: Payer: Self-pay

## 2016-07-26 ENCOUNTER — Ambulatory Visit (HOSPITAL_COMMUNITY): Payer: Medicare Other | Attending: Neurology

## 2016-07-26 ENCOUNTER — Other Ambulatory Visit: Payer: Self-pay

## 2016-07-26 DIAGNOSIS — I639 Cerebral infarction, unspecified: Secondary | ICD-10-CM

## 2016-07-26 DIAGNOSIS — I6381 Other cerebral infarction due to occlusion or stenosis of small artery: Secondary | ICD-10-CM

## 2016-07-26 DIAGNOSIS — I081 Rheumatic disorders of both mitral and tricuspid valves: Secondary | ICD-10-CM | POA: Diagnosis not present

## 2016-07-26 NOTE — Telephone Encounter (Signed)
Rn call patient that the echocardiogram was normal. Pt verbalized understanding.

## 2016-07-26 NOTE — Telephone Encounter (Signed)
-----   Message from Garvin Fila, MD sent at 07/26/2016  3:38 PM EDT ----- Tracy Norman inform the patient that echocardiogram was normal

## 2016-08-02 ENCOUNTER — Encounter: Payer: Self-pay | Admitting: Neurology

## 2016-08-02 ENCOUNTER — Ambulatory Visit (INDEPENDENT_AMBULATORY_CARE_PROVIDER_SITE_OTHER): Payer: Medicare Other | Admitting: Neurology

## 2016-08-02 VITALS — BP 153/69 | HR 77 | Wt 188.8 lb

## 2016-08-02 DIAGNOSIS — I6381 Other cerebral infarction due to occlusion or stenosis of small artery: Secondary | ICD-10-CM

## 2016-08-02 DIAGNOSIS — I639 Cerebral infarction, unspecified: Secondary | ICD-10-CM | POA: Diagnosis not present

## 2016-08-02 NOTE — Patient Instructions (Signed)
I had a long d/w patient about her lacunar stroke, risk for recurrent stroke/TIAs, personally independently reviewed imaging studies and stroke evaluation results and answered questions.Continue Plavix  for secondary stroke prevention and maintain strict control of hypertension with blood pressure goal below 130/90, diabetes with hemoglobin A1c goal below 6.5% and lipids with LDL cholesterol goal below 70 mg/dL. I also advised the patient to eat a healthy diet with plenty of whole grains, cereals, fruits and vegetables, exercise regularly and maintain ideal body weight Followup in the future with my nurse practitioner in 6 months or call earlier if necessary

## 2016-08-02 NOTE — Progress Notes (Signed)
[facility] [address] [place]. [place] [address] [phone]       OFFICE FOLLOW UP VISIT NOTE  Ms. Tracy Norman Date of Birth:  01/18/1941 Medical Record Number:  [id number]   Referring MD:  Derinda Late Reason for Referral:  Stroke  HPI: Initial Consult 06/06/2016 ; Ms Tracy Norman is a 56 year pleasant Caucasian lady who states that in November 2017 she developed sudden onset of speech difficulties, slurred speech and right hand weakness and clumsiness. This lasted for couple of days. She did not seek immediate medical help. She has had transient worsening of the same symptoms on a number of occasions since then but is unable to exactly pinpoint and tell me that triggers. She saw her primary physician Dr. 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 Incidental small 8 x 8 mm left frontal parasagittal as well as a smaller 4 mm or anterior meningiomas are noted. Patient was referred to neurosurgeon Dr. home she saw advised no surgical treatment at the present time. Patient states she recently had labwork done in the primary physician's office and  Lipid profile was okay but I do not have those results to look at. She was previously on aspirin which was changed recently to Plavix which is starting well without bleeding but does get minor bruising. She is tolerating Lipitor well without muscle aches and pains. She states her blood pressure is usually well controlled though it is elevated today at 170/75 in office. She has mild gait difficulties but she blames this on her osteoarthritis for which she has been refusing surgery. She has not had any further stroke related outpatient workup done including echo echocardiogram and Doppler studies. She denies any known prior history of strokes TIAs, significant head injury, loss of  consciousness, seizures or migraines.. He does not smoke or drink. Is not able to be physically active due to her osteoarthritis and knee pain Update 08/02/2016 ;  she returns for follow-up after initial consultation 2 months ago. She states she'll doing well without recurrent stroke or TIA symptoms. Related to Plavix with only minor bruising but no bleeding. Blood pressure usually at home this well controlled the rate is slightly elevated at 153/69 office. He is tolerating Crestor well without muscle aches or pains last lipid profile was satisfactory. She had transthoracic echocardiogram done on 07/26/16 showed normal ejection fraction without cardiac source of embolism. Carotid ultrasound on 07/14/16 showed no significant extracranial stenosis    . Patient states she is watching her diet but she had not been quite active arthritis pain. She plans to be more active ROS:   14 system review of systems is positive for   , easy bruising, joint pain,   and all other systems negative  PMH:  Past Medical History:  Diagnosis Date  . Arthritis   . Brain tumor ([place])   . Breast cancer ([place])   . Cataracts, bilateral   . Cervical cancer ([place])   . Colon polyps   . Dysarthria   . Eczema   . Hyperlipidemia   . Hypertension   . Liver disease   . Osteopenia   . Peripheral vascular disease ([place])   . Stroke ([place])   . Venous insufficiency   . Vision abnormalities     Social History:  Social History   Social History  . Marital status:  Widowed    Spouse name: N/A  . Number of children: 0  . Years of education: N/A   Occupational History  . retired    Social History Main Topics  . Smoking status: Former Smoker    Quit date: 09/25/1978  . Smokeless tobacco: Never Used  . Alcohol use 0.6 oz/week    1 Glasses of wine per week     Comment: occasional  . Drug use: No  . Sexual activity: Not on file   Other Topics Concern  . Not on file   Social History Narrative  . No narrative on file     Medications:   Current Outpatient Prescriptions on File Prior to Visit  Medication Sig Dispense Refill  . alendronate (FOSAMAX) 70 MG tablet Take 70 mg by mouth once a week. Take with a full glass of water on an empty stomach.    . calcium carbonate (OS-CAL) 600 MG TABS Take 600 mg by mouth 2 (two) times daily with a meal.    . Cholecalciferol (VITAMIN D PO) Take 800 Units by mouth 2 (two) times daily.     . clopidogrel (PLAVIX) 75 MG tablet     . lisinopril (PRINIVIL,ZESTRIL) 40 MG tablet     . potassium chloride (K-DUR) 10 MEQ tablet Take 30 mEq by mouth 2 (two) times daily.     . rosuvastatin (CRESTOR) 20 MG tablet Take 20 mg by mouth daily.    Marland Kitchen triamterene-hydrochlorothiazide (MAXZIDE) 75-50 MG tablet      No current facility-administered medications on file prior to visit.     Allergies:   Allergies  Allergen Reactions  . Penicillins Hives    All over the body.    Physical Exam General: Obese elderly Caucasian lady seated, in no evident distress Head: head normocephalic and atraumatic.   Neck: supple with no carotid or supraclavicular bruits Cardiovascular: regular rate and rhythm, no murmurs Musculoskeletal: no deformity Skin:  no rash/petichiae Vascular:  Normal pulses all extremities  Neurologic Exam Mental Status: Awake and fully alert. Oriented to place and time. Recent and remote memory intact. Attention span, concentration and fund of knowledge appropriate. Mood and affect appropriate.  Cranial Nerves: Fundoscopic exam Not done  .  qual, briskly reactive to light. Extraocular movements full without nystagmus. Visual fields full to confrontation. Hearing intact. Facial sensation intact. Face, tongue, palate moves normally and symmetrically.  Motor: Normal bulk and tone. Normal strength in all tested extremity muscles.Diminished fine finger movements on the right. Orbits left-to-right approximately. Minimum right grip weakness. Sensory.: intact to touch , pinprick  , position and vibratory sensation.  Coordination: Rapid alternating movements normal in all extremities. Finger-to-nose and heel-to-shin performed accurately bilaterally. Gait and Station: Arises from chair with  difficulty. Stance is stooped. Gait demonstrates normal stride length and balance but favors left leg due to knee pain. . Unable to heel, toe and tandem walk without difficulty.  Reflexes: 1+ and symmetric. Toes downgoing.      ASSESSMENT:  20 year Caucasian lady with transient speech difficulties and right hand weakness in November 2017 likely due to right pontine lacunar infarct from small vessel disease. Incidental 2 small parasagittal meningiomas. Vascular risk factors of hypertension hyperlipidemia obesity and age   PLAN:  I had a long d/w patient about her lacunar stroke, risk for recurrent stroke/TIAs, personally independently reviewed imaging studies and stroke evaluation results and answered questions.Continue Plavix  for secondary stroke prevention and maintain strict control of hypertension with blood pressure goal below  130/90, diabetes with hemoglobin A1c goal below 6.5% and lipids with LDL cholesterol goal below 70 mg/dL. I also advised the patient to eat a healthy diet with plenty of whole grains, cereals, fruits and vegetables, exercise regularly and maintain ideal body weight . Continue conservative follow-up for her small incidental meningiomas. Greater than 50% time during this 25 minute visit was spent on counseling and coordination of care about her lacunar infarct, small meningiomas and answered questions Followup in the future with my nurse practitioner in 6 months or call earlier if necessary Antony Contras, MD  [facility] [address] [place], [place] [address]  Phone [phone] Fax [phone] Note: This document was prepared with digital dictation and possible smart phrase technology. Any transcriptional errors that result  from this process are unintentional.

## 2016-08-11 ENCOUNTER — Emergency Department (HOSPITAL_COMMUNITY): Payer: Medicare Other

## 2016-08-11 ENCOUNTER — Inpatient Hospital Stay (HOSPITAL_COMMUNITY)
Admission: EM | Admit: 2016-08-11 | Discharge: 2016-08-12 | DRG: 065 | Disposition: A | Discharge status: Home Health | Payer: Medicare Other | Attending: Internal Medicine | Admitting: Internal Medicine

## 2016-08-11 ENCOUNTER — Encounter (HOSPITAL_COMMUNITY): Payer: Self-pay | Admitting: *Deleted

## 2016-08-11 ENCOUNTER — Inpatient Hospital Stay (HOSPITAL_COMMUNITY): Payer: Medicare Other

## 2016-08-11 DIAGNOSIS — R471 Dysarthria and anarthria: Secondary | ICD-10-CM | POA: Diagnosis present

## 2016-08-11 DIAGNOSIS — Z86011 Personal history of benign neoplasm of the brain: Secondary | ICD-10-CM

## 2016-08-11 DIAGNOSIS — I639 Cerebral infarction, unspecified: Secondary | ICD-10-CM | POA: Diagnosis present

## 2016-08-11 DIAGNOSIS — Z7902 Long term (current) use of antithrombotics/antiplatelets: Secondary | ICD-10-CM | POA: Diagnosis not present

## 2016-08-11 DIAGNOSIS — Z87891 Personal history of nicotine dependence: Secondary | ICD-10-CM | POA: Diagnosis not present

## 2016-08-11 DIAGNOSIS — R4781 Slurred speech: Secondary | ICD-10-CM | POA: Diagnosis present

## 2016-08-11 DIAGNOSIS — E782 Mixed hyperlipidemia: Secondary | ICD-10-CM | POA: Diagnosis present

## 2016-08-11 DIAGNOSIS — I679 Cerebrovascular disease, unspecified: Secondary | ICD-10-CM

## 2016-08-11 DIAGNOSIS — K769 Liver disease, unspecified: Secondary | ICD-10-CM | POA: Diagnosis present

## 2016-08-11 DIAGNOSIS — I872 Venous insufficiency (chronic) (peripheral): Secondary | ICD-10-CM | POA: Diagnosis present

## 2016-08-11 DIAGNOSIS — M8580 Other specified disorders of bone density and structure, unspecified site: Secondary | ICD-10-CM | POA: Diagnosis present

## 2016-08-11 DIAGNOSIS — I6302 Cerebral infarction due to thrombosis of basilar artery: Secondary | ICD-10-CM | POA: Diagnosis not present

## 2016-08-11 DIAGNOSIS — E785 Hyperlipidemia, unspecified: Secondary | ICD-10-CM

## 2016-08-11 DIAGNOSIS — G8191 Hemiplegia, unspecified affecting right dominant side: Secondary | ICD-10-CM

## 2016-08-11 DIAGNOSIS — Z85841 Personal history of malignant neoplasm of brain: Secondary | ICD-10-CM | POA: Diagnosis not present

## 2016-08-11 DIAGNOSIS — Z880 Allergy status to penicillin: Secondary | ICD-10-CM

## 2016-08-11 DIAGNOSIS — Z7983 Long term (current) use of bisphosphonates: Secondary | ICD-10-CM

## 2016-08-11 DIAGNOSIS — Z853 Personal history of malignant neoplasm of breast: Secondary | ICD-10-CM | POA: Diagnosis not present

## 2016-08-11 DIAGNOSIS — Z79899 Other long term (current) drug therapy: Secondary | ICD-10-CM | POA: Diagnosis not present

## 2016-08-11 DIAGNOSIS — M1990 Unspecified osteoarthritis, unspecified site: Secondary | ICD-10-CM | POA: Diagnosis present

## 2016-08-11 DIAGNOSIS — R29810 Facial weakness: Secondary | ICD-10-CM | POA: Diagnosis present

## 2016-08-11 DIAGNOSIS — I10 Essential (primary) hypertension: Secondary | ICD-10-CM | POA: Diagnosis present

## 2016-08-11 DIAGNOSIS — I672 Cerebral atherosclerosis: Secondary | ICD-10-CM | POA: Diagnosis present

## 2016-08-11 DIAGNOSIS — I739 Peripheral vascular disease, unspecified: Secondary | ICD-10-CM | POA: Diagnosis present

## 2016-08-11 DIAGNOSIS — I6503 Occlusion and stenosis of bilateral vertebral arteries: Secondary | ICD-10-CM

## 2016-08-11 LAB — COMPREHENSIVE METABOLIC PANEL
ALT: 18 U/L (ref 14–54)
AST: 38 U/L (ref 15–41)
Albumin: 4.5 g/dL (ref 3.5–5.0)
Alkaline Phosphatase: 60 U/L (ref 38–126)
Anion gap: 15 (ref 5–15)
BUN: 23 mg/dL — ABNORMAL HIGH (ref 6–20)
CALCIUM: 10.8 mg/dL — AB (ref 8.9–10.3)
CHLORIDE: 101 mmol/L (ref 101–111)
CO2: 23 mmol/L (ref 22–32)
CREATININE: 1.17 mg/dL — AB (ref 0.44–1.00)
GFR, EST AFRICAN AMERICAN: 51 mL/min — AB (ref >60)
GFR, EST NON AFRICAN AMERICAN: 44 mL/min — AB (ref >60)
Glucose, Bld: 100 mg/dL — ABNORMAL HIGH (ref 65–99)
Potassium: 4.6 mmol/L (ref 3.5–5.1)
Sodium: 139 mmol/L (ref 135–145)
Total Bilirubin: 1.3 mg/dL — ABNORMAL HIGH (ref 0.3–1.2)
Total Protein: 8 g/dL (ref 6.5–8.1)

## 2016-08-11 LAB — CBC
HEMATOCRIT: 48 % — AB (ref 36.0–46.0)
Hemoglobin: 16 g/dL — ABNORMAL HIGH (ref 12.0–15.0)
MCH: 30.2 pg (ref 26.0–34.0)
MCHC: 33.3 g/dL (ref 30.0–36.0)
MCV: 90.7 fL (ref 78.0–100.0)
PLATELETS: 245 10*3/uL (ref 150–400)
RBC: 5.29 MIL/uL — ABNORMAL HIGH (ref 3.87–5.11)
RDW: 13.4 % (ref 11.5–15.5)
WBC: 7.4 10*3/uL (ref 4.0–10.5)

## 2016-08-11 LAB — DIFFERENTIAL
BASOS PCT: 0 %
Basophils Absolute: 0 10*3/uL (ref 0.0–0.1)
EOS PCT: 0 %
Eosinophils Absolute: 0 10*3/uL (ref 0.0–0.7)
Lymphocytes Relative: 13 %
Lymphs Abs: 1 10*3/uL (ref 0.7–4.0)
MONO ABS: 0.2 10*3/uL (ref 0.1–1.0)
Monocytes Relative: 3 %
NEUTROS ABS: 6.2 10*3/uL (ref 1.7–7.7)
Neutrophils Relative %: 84 %

## 2016-08-11 LAB — I-STAT CHEM 8, ED
BUN: 32 mg/dL — ABNORMAL HIGH (ref 6–20)
CREATININE: 1.1 mg/dL — AB (ref 0.44–1.00)
Calcium, Ion: 1.27 mmol/L (ref 1.15–1.40)
Chloride: 102 mmol/L (ref 101–111)
GLUCOSE: 98 mg/dL (ref 65–99)
HEMATOCRIT: 47 % — AB (ref 36.0–46.0)
HEMOGLOBIN: 16 g/dL — AB (ref 12.0–15.0)
Potassium: 4.4 mmol/L (ref 3.5–5.1)
Sodium: 139 mmol/L (ref 135–145)
TCO2: 30 mmol/L (ref 0–100)

## 2016-08-11 LAB — I-STAT TROPONIN, ED: TROPONIN I, POC: 0 ng/mL (ref 0.00–0.08)

## 2016-08-11 LAB — PROTIME-INR
INR: 1.19
PROTHROMBIN TIME: 15.2 s (ref 11.4–15.2)

## 2016-08-11 LAB — APTT: aPTT: 33 seconds (ref 24–36)

## 2016-08-11 LAB — CBG MONITORING, ED: GLUCOSE-CAPILLARY: 114 mg/dL — AB (ref 65–99)

## 2016-08-11 MED ORDER — CALCIUM CARBONATE 1250 (500 CA) MG PO TABS
1.0000 | ORAL_TABLET | Freq: Two times a day (BID) | ORAL | Status: DC
  Administered 2016-08-11: 500 mg via ORAL
  Filled 2016-08-11: qty 1

## 2016-08-11 MED ORDER — ROSUVASTATIN CALCIUM 20 MG PO TABS
20.0000 mg | ORAL_TABLET | Freq: Every day | ORAL | Status: DC
  Administered 2016-08-11 – 2016-08-12 (×2): 20 mg via ORAL
  Filled 2016-08-11 (×2): qty 1

## 2016-08-11 MED ORDER — IOPAMIDOL (ISOVUE-370) INJECTION 76%
INTRAVENOUS | Status: AC
  Administered 2016-08-11: 50 mL
  Filled 2016-08-11: qty 50

## 2016-08-11 MED ORDER — ASPIRIN 325 MG PO TABS: 325.0000 mg | ORAL_TABLET | Freq: Every day | ORAL | Status: DC

## 2016-08-11 MED ORDER — STROKE: EARLY STAGES OF RECOVERY BOOK
Freq: Once | Status: AC
  Administered 2016-08-11: 18:00:00

## 2016-08-11 MED ORDER — ASPIRIN EC 81 MG PO TBEC
81.0000 mg | DELAYED_RELEASE_TABLET | Freq: Every day | ORAL | Status: DC
  Administered 2016-08-11 – 2016-08-12 (×2): 81 mg via ORAL
  Filled 2016-08-11 (×2): qty 1

## 2016-08-11 MED ORDER — CLOPIDOGREL BISULFATE 75 MG PO TABS
75.0000 mg | ORAL_TABLET | Freq: Every day | ORAL | Status: DC
  Administered 2016-08-11 – 2016-08-12 (×2): 75 mg via ORAL
  Filled 2016-08-11 (×2): qty 1

## 2016-08-11 MED ORDER — CHOLECALCIFEROL 10 MCG (400 UNIT) PO TABS
800.0000 [IU] | ORAL_TABLET | Freq: Two times a day (BID) | ORAL | Status: DC
  Administered 2016-08-11 – 2016-08-12 (×2): 800 [IU] via ORAL
  Filled 2016-08-11 (×3): qty 2

## 2016-08-11 MED ORDER — CALCIUM CARBONATE 600 MG PO TABS
600.0000 mg | ORAL_TABLET | Freq: Two times a day (BID) | ORAL | Status: DC
  Filled 2016-08-11: qty 1

## 2016-08-11 MED ORDER — GADOBENATE DIMEGLUMINE 529 MG/ML IV SOLN
20.0000 mL | Freq: Once | INTRAVENOUS | Status: AC
  Administered 2016-08-11: 17 mL via INTRAVENOUS

## 2016-08-11 MED ORDER — ENOXAPARIN SODIUM 40 MG/0.4ML ~~LOC~~ SOLN
40.0000 mg | SUBCUTANEOUS | Status: DC
  Administered 2016-08-11: 40 mg via SUBCUTANEOUS
  Filled 2016-08-11: qty 0.4

## 2016-08-11 MED ORDER — SODIUM CHLORIDE 0.9 % IV BOLUS (SEPSIS)
500.0000 mL | Freq: Once | INTRAVENOUS | Status: AC
  Administered 2016-08-11: 500 mL via INTRAVENOUS

## 2016-08-11 MED ORDER — ASPIRIN 300 MG RE SUPP: 300.0000 mg | Freq: Every day | RECTAL | Status: DC

## 2016-08-11 NOTE — Consult Note (Signed)
Requesting Physician: Dr. Vanita Panda    Chief Complaint: Dysarthria, right sided weakness  History obtained from:  Patient     HPI:                                                                                                                                         Tracy Norman is an 76 y.o. female was recently had a stroke in the past leaving her with right-sided weakness. Patient recently saw her neurologist Dr. Leonie Man and had follow-up diagnostic tests below. Patient states that approximately 4-5 days ago she noted that her dysarthria worsened and yesterday she noted increased weakness in her right arm and leg. For this reason she was brought to the emergency room. 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 Neurology was consult good. Patient states that she has been on Plavix and has not missed any doses.  Date last known well: Date: 04/31/2018 Time last known well: Unable to determine tPA Given: No: out of window   Modified Rankin: Rankin Score=2  07-26-16 echo Left ventricle:  The cavity size was normal. Systolic function was normal. The estimated ejection fraction was in the range of 55% to 60%. Wall motion was normal; there were no regional wall motion abnormalities. Doppler parameters are consistent with abnormal left ventricular relaxation (grade 1 diastolic dysfunction). Doppler parameters are consistent with high ventricular filling pressure  Carotid US: 07-14-16 Normal      Past Medical History:  Diagnosis Date  . Arthritis   . Brain tumor ([place])   . Breast cancer ([place])   . Cataracts, bilateral   . Cervical cancer ([place])   . Colon polyps   . Dysarthria   . Eczema   . Hyperlipidemia   . Hypertension   . Liver disease   . Osteopenia   . Peripheral vascular disease ([place])   . Stroke ([place])   . Venous insufficiency   . Vision abnormalities     Past Surgical History:  Procedure Laterality Date  . BREAST LUMPECTOMY Left 03/15/2010  . CATARACT EXTRACTION  W/ INTRAOCULAR LENS  IMPLANT, BILATERAL Bilateral   . CERVICAL CONIZATION W/BX    . TONSILLECTOMY      Family History  Problem Relation Age of Onset  . Hypertension Mother   . Osteoarthritis Mother   . Heart disease Father     before age 74  . Esophageal cancer Father     mets  . Hypertension Father   . Stroke Brother   . Heart attack Brother   . Hypertension Brother   . Hypertension Brother   . Other Paternal Grandmother     hardening of the arteries  . Heart attack Paternal Grandfather 17   Social History:  reports that she quit smoking about 37 years ago. She has never used smokeless tobacco. She reports that she drinks about 0.6 oz of alcohol per week . She  reports that she does not use drugs.  Allergies:  Allergies  Allergen Reactions  . Penicillins Hives    All over the body.    Medications:                                                                                                                           No current facility-administered medications for this encounter.    Current Outpatient Prescriptions  Medication Sig Dispense Refill  . alendronate (FOSAMAX) 70 MG tablet Take 70 mg by mouth once a week. Take with a full glass of water on an empty stomach.    . calcium carbonate (OS-CAL) 600 MG TABS Take 600 mg by mouth 2 (two) times daily with a meal.    . Cholecalciferol (VITAMIN D PO) Take 800 Units by mouth 2 (two) times daily.     . clopidogrel (PLAVIX) 75 MG tablet Take 75 mg by mouth daily.     Marland Kitchen lisinopril (PRINIVIL,ZESTRIL) 40 MG tablet Take 40 mg by mouth daily.     . potassium chloride (K-DUR) 10 MEQ tablet Take 30 mEq by mouth 2 (two) times daily.     . rosuvastatin (CRESTOR) 20 MG tablet Take 20 mg by mouth daily.    Marland Kitchen triamterene-hydrochlorothiazide (MAXZIDE) 75-50 MG tablet Take 1 tablet by mouth daily.        ROS:                                                                                                                                        History obtained from the patient  General ROS: negative for - chills, fatigue, fever, night sweats, weight gain or weight loss Psychological ROS: negative for - behavioral disorder, hallucinations, memory difficulties, mood swings or suicidal ideation Ophthalmic ROS: negative for - blurry vision, double vision, eye pain or loss of vision ENT ROS: negative for - epistaxis, nasal discharge, oral lesions, sore throat, tinnitus or vertigo Allergy and Immunology ROS: negative for - hives or itchy/watery eyes Hematological and Lymphatic ROS: negative for - bleeding problems, bruising or swollen lymph nodes Endocrine ROS: negative for - galactorrhea, hair pattern changes, polydipsia/polyuria or temperature intolerance Respiratory ROS: negative for - cough, hemoptysis, shortness of breath or wheezing Cardiovascular ROS: negative for - chest pain, dyspnea on exertion, edema or irregular heartbeat Gastrointestinal ROS: negative for - abdominal pain, diarrhea, hematemesis, nausea/vomiting  or stool incontinence Genito-Urinary ROS: negative for - dysuria, hematuria, incontinence or urinary frequency/urgency Musculoskeletal ROS: Positive for - joint swelling or muscular weakness Neurological ROS: as noted in HPI Dermatological ROS: Positive for rash and skin lesion changes--possible ringworm  Neurologic Examination:                                                                                                      Blood pressure (!) 157/63, pulse 78, temperature 98.1 F (36.7 C), temperature source Oral, resp. rate 13, SpO2 96 %.  HEENT-  Normocephalic, no lesions, without obvious abnormality.  Normal external eye and conjunctiva.  Normal TM's bilaterally.  Normal auditory canals and external ears. Normal external nose, mucus membranes and septum.  Normal pharynx. Cardiovascular- S1, S2 normal, pulses palpable throughout   Lungs- chest clear, no wheezing, rales, normal symmetric air entry, Heart  exam - S1, S2 normal, no murmur, no gallop, rate regular Abdomen- normal findings: bowel sounds normal Extremities- Warm dry and intact Lymph-no adenopathy palpable Mild muscular Skin-warm and dry, no hyperpigmentation, vitiligo, or suspicious lesions  Neurological Examination Mental Status: Alert, oriented, thought content appropriate.  Speech dysarthric without evidence of aphasia.  Able to follow 3 step commands without difficulty. Cranial Nerves: II:  Visual fields grossly normal,  III,IV, VI: ptosis not present, extra-ocular motions intact bilaterally, pupils equal, round, reactive to light and accommodation V,VII: Mild right facial asymmetry, facial light touch sensation normal bilaterally VIII: hearing normal bilaterally IX,X: uvula rises symmetrically XI: bilateral shoulder shrug XII: midline tongue extension Motor: Right : Upper extremity   5/5    Left:     Upper extremity   5/5  Lower extremity   5/5     Lower extremity   5/5 --Distal right sided wrist flexion-extension and finger extension 4/5 --Proximal right hip flexion weakness Tone and bulk:normal tone throughout; no atrophy noted Sensory: Pinprick and light touch intact throughout, bilaterally Deep Tendon Reflexes: 2+ and symmetric throughout in the upper extremities with the right being more brisk than the left. 2+ bilateral knee jerk no ankle jerk Plantars: Right: Upgoing   Left: downgoing Cerebellar: normal finger-to-nose, Gait: Not tested       Lab Results: Basic Metabolic Panel:  Recent Labs Lab 08/11/16 1041 08/11/16 1048  NA 139 139  K 4.6 4.4  CL 101 102  CO2 23  --   GLUCOSE 100* 98  BUN 23* 32*  CREATININE 1.17* 1.10*  CALCIUM 10.8*  --     Liver Function Tests:  Recent Labs Lab 08/11/16 1041  AST 38  ALT 18  ALKPHOS 60  BILITOT 1.3*  PROT 8.0  ALBUMIN 4.5   No results for input(s): LIPASE, AMYLASE in the last 168 hours. No results for input(s): AMMONIA in the last 168  hours.  CBC:  Recent Labs Lab 08/11/16 1041 08/11/16 1048  WBC 7.4  --   NEUTROABS 6.2  --   HGB 16.0* 16.0*  HCT 48.0* 47.0*  MCV 90.7  --   PLT 245  --     Cardiac Enzymes: No results  for input(s): CKTOTAL, CKMB, CKMBINDEX, TROPONINI in the last 168 hours.  Lipid Panel: No results for input(s): CHOL, TRIG, HDL, CHOLHDL, VLDL, LDLCALC in the last 168 hours.  CBG:  Recent Labs Lab 08/11/16 1015  GLUCAP 114*    Microbiology: No results found for this or any previous visit.  Coagulation Studies:  Recent Labs  08/11/16 1041  LABPROT 15.2  INR 1.19    Imaging: Mr Jeri Cos And Wo Contrast  Result Date: 08/11/2016 CLINICAL DATA:  Slurred speech and RIGHT-sided weakness for 4 days. History of meningioma. Brain tumor. EXAM: MRI HEAD WITHOUT AND WITH CONTRAST TECHNIQUE: Multiplanar, multiecho pulse sequences of the brain and surrounding structures were obtained without and with intravenous contrast. CONTRAST:  76mL MULTIHANCE GADOBENATE DIMEGLUMINE 529 MG/ML IV SOLN COMPARISON:  MRI brain 04/12/2016. FINDINGS: Brain: There is an acute brainstem infarct on the LEFT, affecting the mid pons. This appears nonhemorrhagic. Developing cytotoxic edema on T2 and FLAIR imaging. No hydrocephalus or extra-axial fluid. Moderate atrophy. Chronic microvascular ischemic change of a mild-to-moderate nature. Post infusion imaging demonstrates interval stability of small LEFT parafalcine meningioma, 8 mm diameter, and small 4 mm meningioma over the convexity, also on the LEFT. The Parafalcine meningioma is adjacent to, but not invading, the superior sagittal sinus. Vascular: Normal flow voids. Skull and upper cervical spine: Normal marrow signal. Sinuses/Orbits: Negative.  BILATERAL cataract extraction. Other: None. IMPRESSION: Acute LEFT paramedian mid pontine infarction, nonhemorrhagic. No evidence for vertebral or basilar thrombosis. Stable small subcentimeter meningiomas, without significant  growth from January. Atrophy and small vessel disease. Electronically Signed   By: Staci Righter M.D.   On: 08/11/2016 12:05       Assessment and plan discussed with with attending physician and they are in agreement.    Etta Quill PA-C [facility] [phone]  08/11/2016, 1:35 PM   Assessment: 76 y.o. female with recent stroke now presenting with increased stroke symptoms from previous stroke which include dysarthria and right-sided weakness. MRI of brain does confirm a left paramedian pontine infarct. Patient recently had a echo and carotid which did not show any significant abnormalities. Patient currently is on Plavix. It would behoove patient to have a A1c, LDL, and CTA of head specifically looking at the posterior circulation done while in the hospital.  Stroke Risk Factors - hyperlipidemia and hypertension

## 2016-08-11 NOTE — ED Notes (Signed)
Patient still in MRI.  

## 2016-08-11 NOTE — ED Notes (Signed)
Patient still off unit for MRI.

## 2016-08-11 NOTE — H&P (Signed)
Date: 08/11/2016               Patient Name:  Tracy Norman MRN: [id number]  DOB: Mar 22, 1941 Age / Sex: 76 y.o., female   PCP: Derinda Late, MD              Medical Service: Internal Medicine Teaching Service              Attending Physician: Dr. Bartholomew Crews, MD    First Contact: Jose Persia, MS3                   After Hours (After 5p/  First Contact Pager: [phone]  weekends / holidays): Second Contact Pager: [phone]   Chief Complaint: Worsening right sidedness weakness with dysarthria   History of Present Illness:  Tracy Norman a right handed 76 yo F with a PMHx significant for breast cancer, benign minengiomas, HTN and mixed hyperlipidemia who presents to the ED with right sided weakness and dysarthria.  Back in Nov '17 she noted that she was having transient right sided weakness that was causing her to have difficulty writing as well as transient slurred speech.  An MRI completed 2 months later showed remote right pontine lacunar infarct as well as incidental small 8 x 8 mm left frontal parasagittal as well as a smaller 4 mm or anterior meningiomas are noted, but nothing that explained her transient neurologic symptoms.  She was taken off of aspirin and put on Plavix at that time.  She then had a follow up with her neurologist outside of [facility] which was inconclusive and was going to have a follow up MRI in May. In early April she followed up with [facility] neurology and had a bilateral carotid US and a transcranial doppler, both of which showed normal findings with no significant blockage   Today the patient reports that she started having slurred speech 5 days ago that has gotten progressively worse.  Then, yesterday she noted that she was having some right sided weakness and difficulty writing.  She came to the ER where an MRI showed an acute infarct in the left anterior pons.  She currently continues to have dysarthria and reported right sided weakness but is otherwise  feeling fine.  She does not report any dizziness, headache, or other neurologic deficits.  She does not report any easy bleeding or bruising, but does have a mildly swollen left ankle, and bilateral ABIs less than <90.    Meds: No current facility-administered medications for this encounter.    Current Outpatient Prescriptions  Medication Sig Dispense Refill  . alendronate (FOSAMAX) 70 MG tablet Take 70 mg by mouth once a week. Take with a full glass of water on an empty stomach.    . calcium carbonate (OS-CAL) 600 MG TABS Take 600 mg by mouth 2 (two) times daily with a meal.    . Cholecalciferol (VITAMIN D PO) Take 800 Units by mouth 2 (two) times daily.     . clopidogrel (PLAVIX) 75 MG tablet Take 75 mg by mouth daily.     Marland Kitchen lisinopril (PRINIVIL,ZESTRIL) 40 MG tablet Take 40 mg by mouth daily.     . potassium chloride (K-DUR) 10 MEQ tablet Take 30 mEq by mouth 2 (two) times daily.     . rosuvastatin (CRESTOR) 20 MG tablet Take 20 mg by mouth daily.    Marland Kitchen triamterene-hydrochlorothiazide (MAXZIDE) 75-50 MG tablet Take 1 tablet by mouth daily.       Allergies: Allergies  as of 08/11/2016 - Review Complete 08/11/2016  Allergen Reaction Noted  . Penicillins Hives 12/26/2010   Past Medical History:  Diagnosis Date  . Arthritis   . Brain tumor ([place])   . Breast cancer ([place])   . Cataracts, bilateral   . Cervical cancer ([place])   . Colon polyps   . Dysarthria   . Eczema   . Hyperlipidemia   . Hypertension   . Liver disease   . Osteopenia   . Peripheral vascular disease ([place])   . Stroke ([place])   . Venous insufficiency   . Vision abnormalities    Past Surgical History:  Procedure Laterality Date  . BREAST LUMPECTOMY Left 03/15/2010  . CATARACT EXTRACTION W/ INTRAOCULAR LENS  IMPLANT, BILATERAL Bilateral   . CERVICAL CONIZATION W/BX    . TONSILLECTOMY     Family History  Problem Relation Age of Onset  . Hypertension Mother   . Osteoarthritis Mother   . Heart disease Father      before age 61  . Esophageal cancer Father     mets  . Hypertension Father   . Stroke Brother   . Heart attack Brother   . Hypertension Brother   . Hypertension Brother   . Other Paternal Grandmother     hardening of the arteries  . Heart attack Paternal Grandfather 62   Social History   Social History  . Marital status: Widowed    Spouse name: N/A  . Number of children: 0  . Years of education: N/A   Occupational History  . retired    Social History Main Topics  . Smoking status: Former Smoker    Quit date: 09/25/1978  . Smokeless tobacco: Never Used  . Alcohol use 0.6 oz/week    1 Glasses of wine per week     Comment: occasional  . Drug use: No  . Sexual activity: Not on file   Other Topics Concern  . Not on file   Social History Narrative  . No narrative on file    Review of Systems: Pertinent items noted in HPI and remainder of comprehensive ROS otherwise negative.  Physical Exam: Blood pressure (!) 163/44, pulse 85, temperature 98.1 F (36.7 C), temperature source Oral, resp. rate 11, SpO2 96 %. General appearance: alert, cooperative and no distress Eyes: conjunctivae/corneas clear. PERRL, EOM's intact. Fundi benign. Lungs: clear to auscultation bilaterally Heart: regular rate and rhythm, S1, S2 normal, no murmur, click, rub or gallop Abdomen: soft, non-tender; bowel sounds normal; no masses,  no organomegaly Extremities: extremities normal, atraumatic, no cyanosis or edema and except for some minor swelling in the left ankle Neurologic: Cranial nerves: VII: lower facial muscle function reduced on the left Sensory: normal Motor: Grossly normal except for 4+ R grip strength.  Strength in R arm and leg appeared normal Coordination: normal     Lab results: CMP Latest Ref Rng & Units 08/11/2016 08/11/2016 02/17/2014  Glucose 65 - 99 mg/dL 98 100(H) 98  BUN 6 - 20 mg/dL 32(H) 23(H) 27.4(H)  Creatinine 0.44 - 1.00 mg/dL 1.10(H) 1.17(H) 1.3(H)  Sodium 135 - 145  mmol/L 139 139 142  Potassium 3.5 - 5.1 mmol/L 4.4 4.6 3.9  Chloride 101 - 111 mmol/L 102 101 -  CO2 22 - 32 mmol/L - 23 29  Calcium 8.9 - 10.3 mg/dL - 10.8(H) 10.2  Total Protein 6.5 - 8.1 g/dL - 8.0 7.2  Total Bilirubin 0.3 - 1.2 mg/dL - 1.3(H) 0.41  Alkaline Phos 38 - 126  U/L - 60 64  AST 15 - 41 U/L - 38 23  ALT 14 - 54 U/L - 18 21   CBC Latest Ref Rng & Units 08/11/2016 08/11/2016 02/17/2014  WBC 4.0 - 10.5 K/uL - 7.4 6.9  Hemoglobin 12.0 - 15.0 g/dL 16.0(H) 16.0(H) 14.6  Hematocrit 36.0 - 46.0 % 47.0(H) 48.0(H) 45.0  Platelets 150 - 400 K/uL - 245 230   Protime-INR: PT was normal at 15.2, PTT was normal at 33, and INR was 1.19  Imaging results:  Mr Jeri Cos And Wo Contrast  Result Date: 08/11/2016 CLINICAL DATA:  Slurred speech and RIGHT-sided weakness for 4 days. History of meningioma. Brain tumor. EXAM: MRI HEAD WITHOUT AND WITH CONTRAST TECHNIQUE: Multiplanar, multiecho pulse sequences of the brain and surrounding structures were obtained without and with intravenous contrast. CONTRAST:  67mL MULTIHANCE GADOBENATE DIMEGLUMINE 529 MG/ML IV SOLN COMPARISON:  MRI brain 04/12/2016. FINDINGS: Brain: There is an acute brainstem infarct on the LEFT, affecting the mid pons. This appears nonhemorrhagic. Developing cytotoxic edema on T2 and FLAIR imaging. No hydrocephalus or extra-axial fluid. Moderate atrophy. Chronic microvascular ischemic change of a mild-to-moderate nature. Post infusion imaging demonstrates interval stability of small LEFT parafalcine meningioma, 8 mm diameter, and small 4 mm meningioma over the convexity, also on the LEFT. The Parafalcine meningioma is adjacent to, but not invading, the superior sagittal sinus. Vascular: Normal flow voids. Skull and upper cervical spine: Normal marrow signal. Sinuses/Orbits: Negative.  BILATERAL cataract extraction. Other: None. IMPRESSION: Acute LEFT paramedian mid pontine infarction, nonhemorrhagic. No evidence for vertebral or basilar  thrombosis. Stable small subcentimeter meningiomas, without significant growth from January. Atrophy and small vessel disease. Electronically Signed   By: Staci Righter M.D.   On: 08/11/2016 12:05    Assessment & Plan by Problem: Active Problems:   CVA (cerebral vascular accident) [facility])  Mrs. Canton is a right handed 76 yo F with a PMHx significant for breast cancer, benign minengiomas, HTN and mixed hyperlipidemia who presents to the ED with right sided weakness and dysarthria, likely due to a left pontine stroke.    Cerebral Vascular Accident (CVA): The patients right sided weakness and slurred speech due to perceived left sided facial weakness is consistent with a left pontine infarct seen on MRI.   The patients previous transient symptoms 4 months prior may have been due to small infarcts that were not detectable on MRI, or transient ischemia to the same area.  While the patient does have 2 benign meningiomas, their location can not explain her neurologic symptoms.  While the patients right sided weakness could be explained by peripheral vascular disease, this would not explain the dysarthria.   Plan: stop patients home diuretics(lisinopril, hydrochlorothiazide)  to ensure proper perfusion of pontine area and prevent further damage.  If the patient is stable, plan to DC tomorrow on both Plavix and aspirin.  Follow up with neurology.    Hypertension:  The patient currently has a normal BP, and because of her stroke we want to ensure a BP that is high to ensure profusion.   Plan: hold her home dose of lisinopril 40 mg daily for permissive hypertension.  We may be able to restart it soon though since her symptoms began 5 days before admission.   History of meningioma:  Her meningiomas continue to be stable on MRI and show no dramatic growth.  They are also not associated with any neurologic symptoms. Plan: Follow up MRI per neurology to follow growth of meningiomas  This is a Location manager Note.  The care of the patient was discussed with Dr. Lynnae January and the assessment and plan was formulated with their assistance.  Please see their note for official documentation of the patient encounter.   Signed: Brendolyn Patty, Medical Student 08/11/2016, 3:02 PM

## 2016-08-11 NOTE — ED Notes (Signed)
Attempted report 

## 2016-08-11 NOTE — ED Triage Notes (Signed)
To ED via pov for eval of slurred speech since Monday. States she had same in Nov with dx of brain tumor in Jan. Supposed to see Dr Rita Ohara today but told by PMD to come to ED. Pt states she has noticed over the last day that her right side is getting weaker. Pt uses cane for assistance walking due to 'bad knees'. Pupils equal and react to light. No arm drift noted. Grips equal and strong. Pt with noted slurred speech

## 2016-08-11 NOTE — ED Notes (Signed)
Pt ambulated to restroom located across from room. Pt stated she felt dizzy while ambulating. Pt placed back in bed and on monitor.

## 2016-08-11 NOTE — H&P (Signed)
Date: 08/11/2016               Patient Name:  Tracy Norman MRN: [id number]  DOB: 09-11-1940 Age / Sex: 76 y.o., female   PCP: Derinda Late, MD         Medical Service: Internal Medicine Teaching Service         Attending Physician: Dr. Bartholomew Crews, MD    First Contact: Dr. Reesa Chew Pager: [phone]  Second Contact: Dr. Marlowe Sax Pager: [phone]       After Hours (After 5p/  First Contact Pager: [phone]  weekends / holidays): Second Contact Pager: [phone]   Chief Complaint: Dysarthria, right sided weakness  History of Present Illness: Tracy Norman is an 76 y.o. female with PMHx Significant for hypertension, breast cancer, meningioma and CVA Came to ED with complaint of worsening slurred speech and right-sided weakness for a few days.  According to patient she noticed some slurred speech on Monday, which slowly got worse intermittently, she also noticed right-sided weakness since yesterday when she was trying to write some checks. She said she had some slurred speech in November 2017, which was resolved within a week, no focal weakness at that time. She had an MRI done in January 2018 and found a lacunar infarct, at that time she was switched from aspirin to Plavix by neurology. She also has an history of meningioma for which she follow-up with neurosurgery-there are no plans for intervention currently. She was already scheduled for MRI for her meningioma follow-up.  She denied any headache, change in vision, upper respiratory symptoms, chest pain, palpitations, nausea, vomiting, diarrhea or constipation. She has no urinary symptoms.  In ED during MRI she found to have acute left paramedian mid pontine impact.   Meds:  Current Meds  Medication Sig  . alendronate (FOSAMAX) 70 MG tablet Take 70 mg by mouth once a week. Take with a full glass of water on an empty stomach.  . calcium carbonate (OS-CAL) 600 MG TABS Take 600 mg by mouth 2 (two) times daily with a meal.  .  Cholecalciferol (VITAMIN D PO) Take 800 Units by mouth 2 (two) times daily.   . clopidogrel (PLAVIX) 75 MG tablet Take 75 mg by mouth daily.   Marland Kitchen lisinopril (PRINIVIL,ZESTRIL) 40 MG tablet Take 40 mg by mouth daily.   . potassium chloride (K-DUR) 10 MEQ tablet Take 30 mEq by mouth 2 (two) times daily.   . rosuvastatin (CRESTOR) 20 MG tablet Take 20 mg by mouth daily.  Marland Kitchen triamterene-hydrochlorothiazide (MAXZIDE) 75-50 MG tablet Take 1 tablet by mouth daily.      Allergies: Allergies as of 08/11/2016 - Review Complete 08/11/2016  Allergen Reaction Noted  . Penicillins Hives 12/26/2010   Past Medical History:  Diagnosis Date  . Arthritis   . Brain tumor ([place])   . Breast cancer ([place])   . Cataracts, bilateral   . Cervical cancer ([place])   . Colon polyps   . Dysarthria   . Eczema   . Hyperlipidemia   . Hypertension   . Liver disease   . Osteopenia   . Peripheral vascular disease ([place])   . Stroke ([place])   . Venous insufficiency   . Vision abnormalities     Family History: Dad died with esophageal cancer at the age of 16, grandfather died of heart problem at age of 78, brother had history of stroke and coronary artery disease.  Social History: Lives alone. Former smoker-quit 30 years ago.  Very occasionally drink alcohol. Denies any illicit drug use.  Review of Systems: A complete ROS was negative except as per HPI.   Physical Exam: Blood pressure 117/73, pulse 78, temperature 98.4 F (36.9 C), temperature source Oral, resp. rate 17, SpO2 97 %. Vitals:   08/11/16 1415 08/11/16 1430 08/11/16 1523 08/11/16 1703  BP: (!) 163/44  (!) 135/99 117/73  Pulse: 89 85 80 78  Resp: 12 11 16 17   Temp:   98.4 F (36.9 C)   TempSrc:   Oral   SpO2: 95% 96% 98% 97%   General: Vital signs reviewed.  Patient is well-developed and well-nourished, in no acute distress and cooperative with exam.  Head: Normocephalic and atraumatic. Eyes: EOMI, conjunctivae normal, no scleral icterus.  Neck:  Supple, trachea midline, normal ROM, no JVD, masses, thyromegaly, or carotid bruit present.  Cardiovascular: RRR, S1 normal, S2 normal, no murmurs, gallops, or rubs. Pulmonary/Chest: Clear to auscultation bilaterally, no wheezes, rales, or rhonchi. Abdominal: Soft, non-tender, non-distended, BS +, no masses, organomegaly, or guarding present.  Extremities: No lower extremity edema bilaterally,  pulses symmetric and intact bilaterally. No cyanosis or clubbing. Neurological: A&O x3, mild dysarthria,Strength is 4+/5 on RUE and 5/5 in RLE and left extremities, cranial nerve II-XII are grossly intact, no focal motor deficit, sensory intact to light touch bilaterally.  Skin: Warm, dry and intact. No rashes or erythema. Psychiatric: Normal mood and affect. speech and behavior is normal. Cognition and memory are normal.  Labs. CBC    Component Value Date/Time   WBC 7.4 08/11/2016 1041   RBC 5.29 (H) 08/11/2016 1041   HGB 16.0 (H) 08/11/2016 1048   HGB 14.6 02/17/2014 1526   HCT 47.0 (H) 08/11/2016 1048   HCT 45.0 02/17/2014 1526   PLT 245 08/11/2016 1041   PLT 230 02/17/2014 1526   MCV 90.7 08/11/2016 1041   MCV 92.5 02/17/2014 1526   MCH 30.2 08/11/2016 1041   MCHC 33.3 08/11/2016 1041   RDW 13.4 08/11/2016 1041   RDW 13.3 02/17/2014 1526   LYMPHSABS 1.0 08/11/2016 1041   LYMPHSABS 1.7 02/17/2014 1526   MONOABS 0.2 08/11/2016 1041   MONOABS 0.5 02/17/2014 1526   EOSABS 0.0 08/11/2016 1041   EOSABS 0.2 02/17/2014 1526   BASOSABS 0.0 08/11/2016 1041   BASOSABS 0.1 02/17/2014 1526   CMP Latest Ref Rng & Units 08/11/2016 08/11/2016 02/17/2014  Glucose 65 - 99 mg/dL 98 100(H) 98  BUN 6 - 20 mg/dL 32(H) 23(H) 27.4(H)  Creatinine 0.44 - 1.00 mg/dL 1.10(H) 1.17(H) 1.3(H)  Sodium 135 - 145 mmol/L 139 139 142  Potassium 3.5 - 5.1 mmol/L 4.4 4.6 3.9  Chloride 101 - 111 mmol/L 102 101 -  CO2 22 - 32 mmol/L - 23 29  Calcium 8.9 - 10.3 mg/dL - 10.8(H) 10.2  Total Protein 6.5 - 8.1 g/dL - 8.0 7.2    Total Bilirubin 0.3 - 1.2 mg/dL - 1.3(H) 0.41  Alkaline Phos 38 - 126 U/L - 60 64  AST 15 - 41 U/L - 38 23  ALT 14 - 54 U/L - 18 21   Troponin. 0.00  EKG: Sinus rhythm with nonspecific T-wave changes.  MRI of brain. FINDINGS: Brain: There is an acute brainstem infarct on the LEFT, affecting the mid pons. This appears nonhemorrhagic. Developing cytotoxic edema on T2 and FLAIR imaging. No hydrocephalus or extra-axial fluid.  Moderate atrophy. Chronic microvascular ischemic change of a mild-to-moderate nature.  Post infusion imaging demonstrates interval stability of small LEFT parafalcine meningioma,  8 mm diameter, and small 4 mm meningioma over the convexity, also on the LEFT. The Parafalcine meningioma is adjacent to, but not invading, the superior sagittal sinus.  Vascular: Normal flow voids.  Skull and upper cervical spine: Normal marrow signal.  Sinuses/Orbits: Negative.  BILATERAL cataract extraction.  Other: None.  IMPRESSION: Acute LEFT paramedian mid pontine infarction, nonhemorrhagic. No evidence for vertebral or basilar thrombosis.  Stable small subcentimeter meningiomas, without significant growth from January.  Atrophy and small vessel disease.  Assessment & Plan by Problem:  Tracy Norman is an 76 y.o. female with PMHx Significant for hypertension, breast cancer, meningioma and CVA Came to ED with complaint of worsening slurred speech and right-sided weakness for a few days.  Stroke. Her MRI was positive for acute mid pontine infarct. She has risk factor being hypertensive and previous CVA. She does not have any significant focal weakness. Echo done on 07/26/2016 was negative for any  Emboli. It shows grade 1 diastolic dysfunction with ejection fraction of 55-60%. Carotid Doppler studies done in 07/14/2016 was negative for any significant stenosis shows mild intimal thickening in ICA. Neurology is following and they wanted to do a CTA head to look  for posterior circulation. -Start her on aspirin 81 mg daily. -Continue Plavix.  -Continue Crestor 20 mg daily. -Permissive hypertension. -PT/OT evaluation.  Hypertension. Currently normotensive. -We will hold her home dose of lisinopril 40 mg daily for permissive hypertension, that might be restarted tomorrow if needed as she had these symptoms going for a few days now.  History of meningioma. Stable in size on recent MRI. She follow-up with neurosurgery.  CODE STATUS. Full DVT prophylaxis. Lovenox Diet. Heart healthy  Dispo: Admit patient to Inpatient with expected length of stay greater than 2 midnights.  Signed: Lorella Nimrod, MD 08/11/2016, 6:56 PM  Pager: [phone]

## 2016-08-11 NOTE — ED Notes (Signed)
Report Given to Charge RN 1M

## 2016-08-11 NOTE — ED Provider Notes (Signed)
[facility] DEPT Provider Note   CSN: [id number] Arrival date & time: 08/11/16  8921     History   Chief Complaint Chief Complaint  Patient presents with  . Aphasia    HPI Tracy Norman is a 76 y.o. female with history of meningioma who presents with worsening right-sided weakness and aphasia that began 5 days ago. Patient has a known meningioma of her brain and is followed by a neurosurgeon, Dr. Sherwood Gambler. Patient was supposed to have an MRI today and an appointment to follow-up on Monday with Dr. Sherwood Gambler. Patient called her PCPs office today to tell them about her symptoms and she was told to come to the emergency department. Patient notes that she's had these symptoms before when she was diagnosed with her brain tumor and prior stroke. Patient denies any pain. She denies any vision changes, fever or chest pain, shortness of breath, abdominal pain, nausea, vomiting, urinary symptoms.  HPI  Past Medical History:  Diagnosis Date  . Arthritis   . Brain tumor ([place])   . Breast cancer ([place])   . Cataracts, bilateral   . Cervical cancer ([place])   . Colon polyps   . Dysarthria   . Eczema   . Hyperlipidemia   . Hypertension   . Liver disease   . Osteopenia   . Peripheral vascular disease ([place])   . Stroke ([place])   . Venous insufficiency   . Vision abnormalities     Patient Active Problem List   Diagnosis Date Noted  . CVA (cerebral vascular accident) ([place]) 08/11/2016  . Lacunar infarct, acute ([place]) 06/06/2016  . Meningioma ([place]) 06/06/2016  . Breast cancer of upper-outer quadrant of left female breast ([place]) 12/26/2010    Past Surgical History:  Procedure Laterality Date  . BREAST LUMPECTOMY Left 03/15/2010  . CATARACT EXTRACTION W/ INTRAOCULAR LENS  IMPLANT, BILATERAL Bilateral   . CERVICAL CONIZATION W/BX    . TONSILLECTOMY      OB History    No data available       Home Medications    Prior to Admission medications   Medication Sig Start Date End Date  Taking? Authorizing Provider  alendronate (FOSAMAX) 70 MG tablet Take 70 mg by mouth once a week. Take with a full glass of water on an empty stomach.   Yes Historical Provider, MD  calcium carbonate (OS-CAL) 600 MG TABS Take 600 mg by mouth 2 (two) times daily with a meal.   Yes Historical Provider, MD  Cholecalciferol (VITAMIN D PO) Take 800 Units by mouth 2 (two) times daily.    Yes Historical Provider, MD  clopidogrel (PLAVIX) 75 MG tablet Take 75 mg by mouth daily.  05/30/16  Yes Historical Provider, MD  lisinopril (PRINIVIL,ZESTRIL) 40 MG tablet Take 40 mg by mouth daily.  12/19/10  Yes Historical Provider, MD  potassium chloride (K-DUR) 10 MEQ tablet Take 30 mEq by mouth 2 (two) times daily.  12/19/10  Yes Historical Provider, MD  rosuvastatin (CRESTOR) 20 MG tablet Take 20 mg by mouth daily.   Yes Historical Provider, MD  triamterene-hydrochlorothiazide (MAXZIDE) 75-50 MG tablet Take 1 tablet by mouth daily.  05/21/16  Yes Historical Provider, MD    Family History Family History  Problem Relation Age of Onset  . Hypertension Mother   . Osteoarthritis Mother   . Heart disease Father     before age 42  . Esophageal cancer Father     mets  . Hypertension Father   . Stroke Brother   .  Heart attack Brother   . Hypertension Brother   . Hypertension Brother   . Other Paternal Grandmother     hardening of the arteries  . Heart attack Paternal Grandfather 46    Social History Social History  Substance Use Topics  . Smoking status: Former Smoker    Quit date: 09/25/1978  . Smokeless tobacco: Never Used  . Alcohol use 0.6 oz/week    1 Glasses of wine per week     Comment: occasional     Allergies   Penicillins   Review of Systems Review of Systems  Constitutional: Negative for chills and fever.  HENT: Negative for facial swelling and sore throat.   Respiratory: Negative for shortness of breath.   Cardiovascular: Negative for chest pain.  Gastrointestinal: Negative for  abdominal pain, nausea and vomiting.  Genitourinary: Negative for dysuria.  Musculoskeletal: Negative for back pain.  Skin: Negative for rash and wound.  Neurological: Positive for speech difficulty and weakness (R sided). Negative for headaches.  Psychiatric/Behavioral: The patient is not nervous/anxious.      Physical Exam Updated Vital Signs BP (!) 163/44   Pulse 85   Temp 98.1 F (36.7 C) (Oral)   Resp 11   SpO2 96%   Physical Exam  Constitutional: She appears well-developed and well-nourished. No distress.  Very mild slurred speech  HENT:  Head: Normocephalic and atraumatic.  Mouth/Throat: Oropharynx is clear and moist. No oropharyngeal exudate.  Eyes: Conjunctivae and EOM are normal. Pupils are equal, round, and reactive to light. Right eye exhibits no discharge. Left eye exhibits no discharge. No scleral icterus.  Neck: Normal range of motion. Neck supple. No thyromegaly present.  Cardiovascular: Normal rate, regular rhythm, normal heart sounds and intact distal pulses.  Exam reveals no gallop and no friction rub.   No murmur heard. Pulmonary/Chest: Effort normal and breath sounds normal. No stridor. No respiratory distress. She has no wheezes. She has no rales.  Abdominal: Soft. Bowel sounds are normal. She exhibits no distension. There is no tenderness. There is no rebound and no guarding.  Musculoskeletal: She exhibits no edema.  Lymphadenopathy:    She has no cervical adenopathy.  Neurological: She is alert. Coordination normal.  CN 3-12 intact; normal sensation throughout; 5/5 strength in all 4 extremities, no noticeable deficit on the right side; equal bilateral grip strength  Skin: Skin is warm and dry. No rash noted. She is not diaphoretic. No pallor.  Psychiatric: She has a normal mood and affect.  Nursing note and vitals reviewed.    ED Treatments / Results  Labs (all labs ordered are listed, but only abnormal results are displayed) Labs Reviewed  CBC -  Abnormal; Notable for the following:       Result Value   RBC 5.29 (*)    Hemoglobin 16.0 (*)    HCT 48.0 (*)    All other components within normal limits  COMPREHENSIVE METABOLIC PANEL - Abnormal; Notable for the following:    Glucose, Bld 100 (*)    BUN 23 (*)    Creatinine, Ser 1.17 (*)    Calcium 10.8 (*)    Total Bilirubin 1.3 (*)    GFR calc non Af Amer 44 (*)    GFR calc Af Amer 51 (*)    All other components within normal limits  CBG MONITORING, ED - Abnormal; Notable for the following:    Glucose-Capillary 114 (*)    All other components within normal limits  I-STAT CHEM 8, ED -  Abnormal; Notable for the following:    BUN 32 (*)    Creatinine, Ser 1.10 (*)    Hemoglobin 16.0 (*)    HCT 47.0 (*)    All other components within normal limits  PROTIME-INR  APTT  DIFFERENTIAL  I-STAT TROPOININ, ED    EKG  EKG Interpretation  Date/Time:  Friday Aug 11 2016 10:10:18 EDT Ventricular Rate:  112 PR Interval:    QRS Duration: 100 QT Interval:  277 QTC Calculation: 378 R Axis:   56 Text Interpretation:  Sinus tachycardia Ventricular premature complex Low voltage, precordial leads T wave abnormality Baseline wander in lead(s) III Artifact Abnormal ekg Confirmed by Carmin Muskrat  MD [phone]) on 08/11/2016 10:31:10 AM       Radiology Mr Brain W And Wo Contrast  Result Date: 08/11/2016 CLINICAL DATA:  Slurred speech and RIGHT-sided weakness for 4 days. History of meningioma. Brain tumor. EXAM: MRI HEAD WITHOUT AND WITH CONTRAST TECHNIQUE: Multiplanar, multiecho pulse sequences of the brain and surrounding structures were obtained without and with intravenous contrast. CONTRAST:  96mL MULTIHANCE GADOBENATE DIMEGLUMINE 529 MG/ML IV SOLN COMPARISON:  MRI brain 04/12/2016. FINDINGS: Brain: There is an acute brainstem infarct on the LEFT, affecting the mid pons. This appears nonhemorrhagic. Developing cytotoxic edema on T2 and FLAIR imaging. No hydrocephalus or extra-axial fluid.  Moderate atrophy. Chronic microvascular ischemic change of a mild-to-moderate nature. Post infusion imaging demonstrates interval stability of small LEFT parafalcine meningioma, 8 mm diameter, and small 4 mm meningioma over the convexity, also on the LEFT. The Parafalcine meningioma is adjacent to, but not invading, the superior sagittal sinus. Vascular: Normal flow voids. Skull and upper cervical spine: Normal marrow signal. Sinuses/Orbits: Negative.  BILATERAL cataract extraction. Other: None. IMPRESSION: Acute LEFT paramedian mid pontine infarction, nonhemorrhagic. No evidence for vertebral or basilar thrombosis. Stable small subcentimeter meningiomas, without significant growth from January. Atrophy and small vessel disease. Electronically Signed   By: Staci Righter M.D.   On: 08/11/2016 12:05    Procedures Procedures (including critical care time)  Medications Ordered in ED Medications  gadobenate dimeglumine (MULTIHANCE) injection 20 mL (17 mLs Intravenous Contrast Given 08/11/16 1200)     Initial Impression / Assessment and Plan / ED Course  I have reviewed the triage vital signs and the nursing notes.  Pertinent labs & imaging results that were available during my care of the patient were reviewed by me and considered in my medical decision making (see chart for details).     Patient with MRI showing acute left paramedian mid pontine infarction, nonhemorrhagic; no evidence for vertebral or basilar thrombosis; stable small subcentimeter meningiomas, without significant growth from January and equal atrophy and vessel disease. CBC shows hemoglobin 16. CMP shows BUN 23, creatinine 1.17, 10.8, total bilirubin 1.3. I consulted neurologist, Dr. Shon Hale, who will follow the patient and recommends admission by medicine. I spoke with the internal medicine teaching service who will admit the patient for further evaluation and treatment. Patient also evaluated by Dr. Vanita Panda who guided the patient's  management and agrees with plan.  Final Clinical Impressions(s) / ED Diagnoses   Final diagnoses:  [facility])    New Prescriptions Current Discharge Medication List       Frederica Kuster, Hershal Coria 08/11/16 1521    Carmin Muskrat, MD 08/15/16 [phone]

## 2016-08-12 DIAGNOSIS — Z853 Personal history of malignant neoplasm of breast: Secondary | ICD-10-CM

## 2016-08-12 DIAGNOSIS — E785 Hyperlipidemia, unspecified: Secondary | ICD-10-CM

## 2016-08-12 DIAGNOSIS — I10 Essential (primary) hypertension: Secondary | ICD-10-CM

## 2016-08-12 DIAGNOSIS — Z880 Allergy status to penicillin: Secondary | ICD-10-CM

## 2016-08-12 DIAGNOSIS — I6302 Cerebral infarction due to thrombosis of basilar artery: Secondary | ICD-10-CM

## 2016-08-12 DIAGNOSIS — I6503 Occlusion and stenosis of bilateral vertebral arteries: Secondary | ICD-10-CM

## 2016-08-12 DIAGNOSIS — Z85841 Personal history of malignant neoplasm of brain: Secondary | ICD-10-CM

## 2016-08-12 LAB — BASIC METABOLIC PANEL
Anion gap: 13 (ref 5–15)
BUN: 25 mg/dL — ABNORMAL HIGH (ref 6–20)
CALCIUM: 9.6 mg/dL (ref 8.9–10.3)
CHLORIDE: 100 mmol/L — AB (ref 101–111)
CO2: 23 mmol/L (ref 22–32)
CREATININE: 1.13 mg/dL — AB (ref 0.44–1.00)
GFR, EST AFRICAN AMERICAN: 53 mL/min — AB (ref >60)
GFR, EST NON AFRICAN AMERICAN: 46 mL/min — AB (ref >60)
Glucose, Bld: 96 mg/dL (ref 65–99)
POTASSIUM: 3.6 mmol/L (ref 3.5–5.1)
SODIUM: 136 mmol/L (ref 135–145)

## 2016-08-12 LAB — LIPID PANEL
Cholesterol: 101 mg/dL (ref 0–200)
HDL: 43 mg/dL (ref >40)
LDL Cholesterol: 26 mg/dL (ref 0–99)
TRIGLYCERIDES: 159 mg/dL — AB (ref <150)
Total CHOL/HDL Ratio: 2.3 RATIO
VLDL: 32 mg/dL (ref 0–40)

## 2016-08-12 LAB — PHOSPHORUS: Phosphorus: 3.9 mg/dL (ref 2.5–4.6)

## 2016-08-12 MED ORDER — CALCIUM CARBONATE 1250 (500 CA) MG PO TABS
1250.0000 mg | ORAL_TABLET | Freq: Two times a day (BID) | ORAL | Status: DC
  Administered 2016-08-12: 1250 mg via ORAL
  Filled 2016-08-12: qty 1

## 2016-08-12 MED ORDER — ASPIRIN EC 325 MG PO TBEC: 325.0000 mg | DELAYED_RELEASE_TABLET | Freq: Every day | ORAL | Status: DC

## 2016-08-12 MED ORDER — ASPIRIN 325 MG PO TBEC: 325.0000 mg | DELAYED_RELEASE_TABLET | Freq: Every day | ORAL | 0 refills | Status: DC

## 2016-08-12 NOTE — Evaluation (Signed)
Physical Therapy Evaluation Patient Details Name: Tracy Norman MRN: [id number] DOB: 09-18-1940 Today's Date: 08/12/2016   History of Present Illness  76 y.o. female with PMHx Significant for hypertension, breast cancer, meningioma and CVA Came to ED with complaint of worsening slurred speech and right-sided weakness for a few days; MRI postive for acute mid pontine infarct  Clinical Impression  Patient demonstrates deficits in functional mobility as indicated below. Will need continued skilled PT to address deficits and maximize function. Will see as indicated and progress as tolerated.  Recommend home health PT upon acute discharge and initial 24/7 supervision    Follow Up Recommendations Home health PT;Supervision/Assistance - 24 hour    Equipment Recommendations  None recommended by PT    Recommendations for Other Services       Precautions / Restrictions Precautions Precautions: Fall      Mobility  Bed Mobility                  Transfers Overall transfer level: Needs assistance Equipment used: Rolling walker (2 wheeled) Transfers: Sit to/from Stand Sit to Stand: Min guard         General transfer comment: increased time and effort to perform, use of chair arm rail and UE support. no physical assist required. min guard for safety  Ambulation/Gait Ambulation/Gait assistance: Supervision;Min guard Ambulation Distance (Feet): 160 Feet Assistive device: Rolling walker (2 wheeled) Gait Pattern/deviations: Step-through pattern;Decreased stride length;Decreased step length - right;Decreased dorsiflexion - right;Trunk flexed Gait velocity: decreased   General Gait Details: patient with noted RLE drag during gait, liited activity tolerance due to fatigue and arthritic pain in LEs.  Stairs Stairs: Yes Stairs assistance: Min assist Stair Management: One rail Left Number of Stairs: 3 General stair comments: Assist for stability, educated on sequencing and  safety  Wheelchair Mobility    Modified Rankin (Stroke Patients Only)       Balance Overall balance assessment: Needs assistance   Sitting balance-Leahy Scale: Good     Standing balance support: Bilateral upper extremity supported;During functional activity Standing balance-Leahy Scale: Fair Standing balance comment: able to release UE support for breif periods                             Pertinent Vitals/Pain Pain Assessment: Faces Faces Pain Scale: Hurts little more Pain Location: bilateral LE arthritic pain Pain Descriptors / Indicators: Discomfort Pain Intervention(s): Limited activity within patient's tolerance;Monitored during session    Home Living Family/patient expects to be discharged to:: Private residence Living Arrangements: Alone Available Help at Discharge: Family;Available PRN/intermittently Type of Home: House Home Access: Stairs to enter Entrance Stairs-Rails: None Entrance Stairs-Number of Steps: 2 Home Layout: One level Home Equipment: Walker - 4 wheels;Cane - quad;Tub bench;Shower seat;Transport chair      Prior Function Level of Independence: Independent with assistive device(s)         Comments: used a RW in the house and quad cane in community; still driving     Hand Dominance   Dominant Hand: Right    Extremity/Trunk Assessment   Upper Extremity Assessment Upper Extremity Assessment: Defer to OT evaluation    Lower Extremity Assessment Lower Extremity Assessment: RLE deficits/detail;LLE deficits/detail RLE Deficits / Details: right LE weakness noted, 4/5 gross motions, LE lag during functional gait RLE Coordination: decreased gross motor LLE Deficits / Details: LLE limited by arthritic pain and weakness       Communication   Communication: No difficulties  Cognition Arousal/Alertness: Awake/alert Behavior During Therapy: WFL for tasks assessed/performed Overall Cognitive Status: Within Functional Limits for  tasks assessed                                        General Comments      Exercises     Assessment/Plan    PT Assessment Patient needs continued PT services  PT Problem List Decreased strength;Decreased balance;Decreased activity tolerance;Pain       PT Treatment Interventions DME instruction;Gait training;Stair training;Functional mobility training;Therapeutic activities;Therapeutic exercise;Balance training;Neuromuscular re-education;Patient/family education    PT Goals (Current goals can be found in the Care Plan section)  Acute Rehab PT Goals Patient Stated Goal: to go home (brother to help) PT Goal Formulation: With patient Time For Goal Achievement: 08/26/16 Potential to Achieve Goals: Good    Frequency Min 4X/week   Barriers to discharge        Co-evaluation               AM-PAC PT "6 Clicks" Daily Activity  Outcome Measure Difficulty turning over in bed (including adjusting bedclothes, sheets and blankets)?: Total Difficulty moving from lying on back to sitting on the side of the bed? : Total Difficulty sitting down on and standing up from a chair with arms (e.g., wheelchair, bedside commode, etc,.)?: Total Help needed moving to and from a bed to chair (including a wheelchair)?: A Little Help needed walking in hospital room?: A Little Help needed climbing 3-5 steps with a railing? : A Little 6 Click Score: 12    End of Session Equipment Utilized During Treatment: Gait belt Activity Tolerance: Patient limited by fatigue Patient left: in chair;with call bell/phone within reach;with chair alarm set Nurse Communication: Mobility status PT Visit Diagnosis: Unsteadiness on feet (R26.81);Hemiplegia and hemiparesis Hemiplegia - Right/Left: Right Hemiplegia - dominant/non-dominant: Dominant Hemiplegia - caused by: Cerebral infarction    Time: 0901-0917 PT Time Calculation (min) (ACUTE ONLY): 16 min   Charges:   PT Evaluation $PT Eval  Moderate Complexity: 1 Procedure     PT G Codes:        Alben Deeds, PT DPT  [phone]   Duncan Dull 08/12/2016, 9:38 AM

## 2016-08-12 NOTE — Progress Notes (Signed)
STROKE TEAM PROGRESS NOTE   HISTORY OF PRESENT ILLNESS (per record) Tracy Norman is an 76 y.o. female was recently had a stroke in the past leaving her with right-sided weakness. Patient recently saw her neurologist Dr. Leonie Man and had follow-up diagnostic tests below. Patient states that approximately 4-5 days ago she noted that her dysarthria worsened and yesterday she noted increased weakness in her right arm and leg. For this reason she was brought to the emergency room. 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 Neurology was consulted. Patient states that she has been on Plavix and has not missed any doses.  Date last known well: Date: 04/31/2018 Time last known well: Unable to determine tPA Given: No: out of window   SUBJECTIVE (INTERVAL HISTORY) Her husband is at the bedside.  She still has dysarthria and right sided weakness. MRI showed left pontine infarct.    OBJECTIVE Temp:  [97.5 F (36.4 C)-98.8 F (37.1 C)] 98.8 F (37.1 C) (05/05 1309) Pulse Rate:  [65-93] 68 (05/05 1309) Cardiac Rhythm: Normal sinus rhythm (05/05 0828) Resp:  [16-18] 18 (05/05 1309) BP: (105-141)/(40-73) 138/47 (05/05 1257) SpO2:  [94 %-98 %] 97 % (05/05 1309)  CBC:   Recent Labs Lab 08/11/16 1041 08/11/16 1048  WBC 7.4  --   NEUTROABS 6.2  --   HGB 16.0* 16.0*  HCT 48.0* 47.0*  MCV 90.7  --   PLT 245  --     Basic Metabolic Panel:   Recent Labs Lab 08/11/16 1041 08/11/16 1048 08/12/16 0403 08/12/16 1103  NA 139 139 136  --   K 4.6 4.4 3.6  --   CL 101 102 100*  --   CO2 23  --  23  --   GLUCOSE 100* 98 96  --   BUN 23* 32* 25*  --   CREATININE 1.17* 1.10* 1.13*  --   CALCIUM 10.8*  --  9.6  --   PHOS  --   --   --  3.9    Lipid Panel:     Component Value Date/Time   CHOL 101 08/12/2016 1103   TRIG 159 (H) 08/12/2016 1103   HDL 43 08/12/2016 1103   CHOLHDL 2.3 08/12/2016 1103   VLDL 32 08/12/2016 1103   LDLCALC 26 08/12/2016 1103   HgbA1c: No results found  for: HGBA1C Urine Drug Screen: No results found for: LABOPIA, COCAINSCRNUR, LABBENZ, AMPHETMU, THCU, LABBARB  Alcohol Level No results found for: [place] I have personally reviewed the radiological images below and agree with the radiology interpretations.  Ct Angio Head W Or Wo Contrast 08/11/2016 1. No acute arterial finding.  2. Intracranial atherosclerosis with notably extensive plaque on the bilateral V4 segments causing moderate to advanced narrowing on the right.   Mr Jeri Cos And Wo Contrast 08/11/2016 Acute LEFT paramedian mid pontine infarction, nonhemorrhagic. No evidence for vertebral or basilar thrombosis. Stable small subcentimeter meningiomas, without significant growth from January. Atrophy and small vessel disease.   CUS 06/22/16 - no significant stenosis noted.  2D Echo - 07/26/2016 - EF 55-60%. No cardiac source of emboli identified.    PHYSICAL EXAM  Temp:  [97.5 F (36.4 C)-98.8 F (37.1 C)] 98.8 F (37.1 C) (05/05 1309) Pulse Rate:  [65-93] 68 (05/05 1309) Resp:  [16-18] 18 (05/05 1309) BP: (105-141)/(40-73) 138/47 (05/05 1257) SpO2:  [94 %-98 %] 97 % (05/05 1309)  General - Well nourished, well developed, in no apparent distress.  Ophthalmologic - Sharp disc  margins OU.  Cardiovascular - Regular rate and rhythm.  Mental Status -  Level of arousal and orientation to time, place, and person were intact. Language including expression, naming, repetition, comprehension was assessed and found intact. Fund of Knowledge was assessed and was intact.  Cranial Nerves II - XII - II - Visual field intact OU. III, IV, VI - Extraocular movements intact. V - Facial sensation intact bilaterally. VII - right facial droop. VIII - Hearing & vestibular intact bilaterally. X - Palate elevates symmetrically, mild dysarthria. XI - Chin turning & shoulder shrug intact bilaterally. XII - Tongue protrusion intact.  Motor Strength - The patient's strength was 4/5 RUE  and RLE with right hand dexterity difficulty and pronator drift was present on the right, LUE and LLE 5/5.  Bulk was normal and fasciculations were absent.   Motor Tone - Muscle tone was assessed at the neck and appendages and was normal.  Reflexes - The patient's reflexes were 1+ in all extremities and she had no pathological reflexes.  Sensory - Light touch, temperature/pinprick were assessed and were symmetrical.    Coordination - The patient had normal movements in the hands with no ataxia or dysmetria.  Tremor was absent.  Gait and Station - deferred.   ASSESSMENT/PLAN Ms. Tracy Norman is a 76 y.o. female with history of a previous stroke with residual right-sided weakness and dysarthria, peripheral vascular disease, liver disease, hypertension, hyperlipidemia, cervical cancer, breast cancer, and history of a brain tumor (meningioma) presenting with increased dysarthria and right-sided weakness. She did not receive IV t-PA due to late presentation.  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   CTA - Intracranial atherosclerosis with notably extensive plaque on the bilateral V4 segments  Carotid Doppler 06/22/16 - negative  2D Echo - 07/26/2016 - EF 55-60%. No cardiac source of emboli identified.  LDL - 26  HgbA1c pending  VTE prophylaxis - Lovenox Diet Heart Room service appropriate? Yes; Fluid consistency: Thin Diet - low sodium heart healthy  clopidogrel 75 mg daily prior to admission, now on aspirin 325 mg daily and clopidogrel 75 mg daily. Continue DAPT for 3 months and then plavix alone.   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  Hypertension  Stable  Permissive hypertension (OK if < 220/120) but gradually normalize in 5-7 days  Long-term BP goal normotensive  Hyperlipidemia  Home meds:  Crestor 20 mg daily resumed in hospital  LDL 26, goal < 70  Continue statin at discharge  Other Stroke Risk Factors  Advanced age  The patient quit smoking 37 years ago.  ETOH use, advised to drink no more than 1 drink per day  Family hx stroke (brother)  Other Active Problems  Renal insufficiency - 25 / 1.13  Elevated hemoglobin and hematocrit - 16 / [facility] day # 1  Neurology will sign off. Please call with questions. Pt will follow up with Dr. Leonie Man at [facility] in about 6 weeks. Thanks for the consult.  Rosalin Hawking, MD PhD Stroke Neurology 08/12/2016 4:00 PM   To contact Stroke Continuity provider, please refer  to [email or web address] After hours, contact General Neurology

## 2016-08-12 NOTE — Progress Notes (Addendum)
Patient ready for discharge to home; discharge instructions given and reviewed; patient dressed and ready; brother will accompany patient home; patient discharged out via wheelchair.

## 2016-08-12 NOTE — Discharge Summary (Signed)
Name: Tracy Norman MRN: [id number] DOB: [birth year] 76 y.o. PCP: Derinda Late, MD  Date of Admission: 08/11/2016 10:02 AM Date of Discharge: 08/12/2016 Attending Physician: Bartholomew Crews, MD  Discharge Diagnosis: 1. CVA  2. Hypertension  Discharge Medications: Allergies as of 08/12/2016      Reactions   Penicillins Hives   All over the body.      Medication List    STOP taking these medications   potassium chloride 10 MEQ tablet Commonly known as:  K-DUR   triamterene-hydrochlorothiazide 75-50 MG tablet Commonly known as:  MAXZIDE     TAKE these medications   alendronate 70 MG tablet Commonly known as:  FOSAMAX Take 70 mg by mouth once a week. Take with a full glass of water on an empty stomach.   aspirin 325 MG EC tablet Take 1 tablet (325 mg total) by mouth daily.   calcium carbonate 600 MG Tabs tablet Commonly known as:  OS-CAL Take 600 mg by mouth 2 (two) times daily with a meal.   clopidogrel 75 MG tablet Commonly known as:  PLAVIX Take 75 mg by mouth daily.   lisinopril 40 MG tablet Commonly known as:  PRINIVIL,ZESTRIL Take 40 mg by mouth daily.   rosuvastatin 20 MG tablet Commonly known as:  CRESTOR Take 20 mg by mouth daily.   VITAMIN D PO Take 800 Units by mouth 2 (two) times daily.       Disposition and follow-up:   Tracy Norman was discharged from [facility] in Good condition.  At the hospital follow up visit please address:  1.  Any new neurologic deficit. -She was started on dual antiplatelets aspirin and Plavix which she should continue for 3 months. Please watch for any signs of bleeding. -Her blood pressure has she has softer to normal blood pressure during her stay-we held her home dose of lisinopril and Maxzide, we restarted her lisinopril and advised her to keep her systolic blood pressure around 1:30 to 140 to maintain a good intracerebral perfusion. -She should discontinue or decrease her calcium  intake as her calcium levels were mildly elevated and parathyroid levels are low.  2.  Labs / imaging needed at time of follow-up: Renal functions.  3.  Pending labs/ test needing follow-up: None  Follow-up Appointments: Follow-up Information    Derinda Late, MD. Schedule an appointment as soon as possible for a visit.   Specialty:  Family Medicine Contact information: [place] [address] [phone]        Garvin Fila, MD. Schedule an appointment as soon as possible for a visit.   Specialties:  Neurology, Radiology Why:  To be seen in 6 weeks. Contact information: [address] [place] [address] [phone]        [facility] Follow up.   Why:  your home health agency will call you to schedule your at home therapies Contact information: [address] [place] [address] [phone]           Hospital Course by problem list:  Tracy Norman an 76 y.o.femalewith PMHx Significant for hypertension, breast cancer, meningioma and CVA Came to ED with complaint of worsening slurred speech and right-sided weakness for a few days.  CVA.Her MRI was positive for acute mid pontine infarct. Her neurologic exam remained unchanged which include mild dysarthria and 4+/ 5 strength at right hand grip. Her CTA was showing extensive intracranial atherosclerosis causing moderate  to advanced narrowing intermittently. Most likely the cause of her current stroke. We discharged her with home health care for PT, OT and speech therapy. We also started her on dual antiplatelet according to neurology recommendations including full dose aspirin and Plavix, she should remain on them for 3 month. She should follow up with neurology in 6 weeks. We continued her home dose of Crestor 20 mg daily.  Hypertension. Her blood pressure remained softer to normotensive during her stay. Initially we held all her home  antihypertensives for permissive hypertension. She was discharged home on lisinopril and advised to keep holding Maxzide. She was also advised to keep her systolic pressure around 412 to 140 to maintain a good intracerebral perfusion. Her PCP can monitor her blood pressure and titrate her need of antihypertensives accordingly.  Mild hypercalcemia. She shows intermittently mild hypercalcemia on her BMPs. Her parathyroid levels are low and phosphorous is within normal limit. Most likely her calcium supplements are the cause of her hypercalcemia. I called the patient and advised her to stop taking calcium supplements. Her PCP can monitor her calcium levels and advise her accordingly.  History of meningioma.Stable in size on recent MRI. She follow-up with neurosurgery.  Discharge Vitals:   BP (!) 138/47 (BP Location: Right Arm)   Pulse 68   Temp 98.8 F (37.1 C) (Oral)   Resp 18   SpO2 97%   Gen. Well-developed, well-nourished, pleasant lady, in no acute distress. Lungs. Clear bilaterally CV. Regular rate and rhythm. Extremities. Trace pedal edema, pulses intact and symmetrical. Neuro. Very mild dysarthria, strength 4+/ 5 at right hand grip, it was 5 / 5 elsewhere. Cranial nerve II through XII intact. Sensation symmetrical and intact bilaterally.  Pertinent Labs, Studies, and Procedures:  CBC Latest Ref Rng & Units 08/11/2016 08/11/2016 02/17/2014  WBC 4.0 - 10.5 K/uL - 7.4 6.9  Hemoglobin 12.0 - 15.0 g/dL 16.0(H) 16.0(H) 14.6  Hematocrit 36.0 - 46.0 % 47.0(H) 48.0(H) 45.0  Platelets 150 - 400 K/uL - 245 230   CMP Latest Ref Rng & Units 08/12/2016 08/11/2016 08/11/2016  Glucose 65 - 99 mg/dL 96 98 100(H)  BUN 6 - 20 mg/dL 25(H) 32(H) 23(H)  Creatinine 0.44 - 1.00 mg/dL 1.13(H) 1.10(H) 1.17(H)  Sodium 135 - 145 mmol/L 136 139 139  Potassium 3.5 - 5.1 mmol/L 3.6 4.4 4.6  Chloride 101 - 111 mmol/L 100(L) 102 101  CO2 22 - 32 mmol/L 23 - 23  Calcium 8.9 - 10.3 mg/dL 9.6 - 10.8(H)  Total  Protein 6.5 - 8.1 g/dL - - 8.0  Total Bilirubin 0.3 - 1.2 mg/dL - - 1.3(H)  Alkaline Phos 38 - 126 U/L - - 60  AST 15 - 41 U/L - - 38  ALT 14 - 54 U/L - - 18   Lipid Panel     Component Value Date/Time   CHOL 101 08/12/2016 1103   TRIG 159 (H) 08/12/2016 1103   HDL 43 08/12/2016 1103   CHOLHDL 2.3 08/12/2016 1103   VLDL 32 08/12/2016 1103   LDLCALC 26 08/12/2016 1103   A1c. 4.8 Phosphorous. 3.9 Hemoglobin A1c. Pending Parathyroid hormone. 8(low)  EKG: Sinus rhythm with nonspecific T-wave changes.  MRI of brain. FINDINGS: Brain: There is an acute brainstem infarct on the LEFT, affecting the mid pons. This appears nonhemorrhagic. Developing cytotoxic edema on T2 and FLAIR imaging. No hydrocephalus or extra-axial fluid.  Moderate atrophy. Chronic microvascular ischemic change of a mild-to-moderate nature.  Post infusion imaging demonstrates interval stability of small  LEFT parafalcine meningioma, 8 mm diameter, and small 4 mm meningioma over the convexity, also on the LEFT. The Parafalcine meningioma is adjacent to, but not invading, the superior sagittal sinus.  Vascular: Normal flow voids.  Skull and upper cervical spine: Normal marrow signal.  Sinuses/Orbits: Negative. BILATERAL cataract extraction.  Other: None.  IMPRESSION: Acute LEFT paramedian mid pontine infarction, nonhemorrhagic. No evidence for vertebral or basilar thrombosis.  Stable small subcentimeter meningiomas, without significant growth from January.  Atrophy and small vessel disease.  CTA Head. FINDINGS: CT HEAD  Brain: Acute left pontine infarct by MRI is not clearly visible. No evidence of interval infarct or hemorrhage. Microvascular ischemic change in the cerebral white matter. No 9 mm hemangioma, left parafalcine at the vertex.  Vascular: See below  Skull: No acute or aggressive finding  Sinuses: Negative  Orbits: Bilateral cataract resection.  CTA HEAD  Anterior  circulation: Heavy atherosclerotic calcification of the carotid siphons with intermittent mild narrowing. No focal or flow limiting stenosis noted. 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 No major branch occlusion noted. Negative for aneurysm.  Posterior circulation: Heavy calcification of the bilateral vertebral arteries with moderate to advanced stenosis on the right just beyond the dura. Mild mid basilar narrowing. No evidence of basilar dissection or high-grade stenosis. Hypoplastic left P1 segment. Symmetric opacification of the mildly irregular PCAs.  Venous sinuses: Patent  Anatomic variants: None other than described above  Delayed phase: Enhancing meningioma, left parafalcine at the vertex measuring At 9 mm.  IMPRESSION: 1. No acute arterial finding. 2. Intracranial atherosclerosis with notably extensive plaque on the bilateral V4 segments causing moderate to advanced narrowing on the right.   Discharge Instructions: Discharge Instructions    Ambulatory referral to Neurology    Complete by:  As directed    Follow up with Dr. Leonie Man at [facility] in 8 weeks. Thanks.   Diet - low sodium heart healthy    Complete by:  As directed    Discharge instructions    Complete by:  As directed    It was pleasure taking care of you. We did make some changes in your blood pressure medication, stop taking your combination diuretic pill and potassium, your PCP can restart it if needed. We are also adding Aspirin 325 mg daily along with Plavix, you have to take both for at least 3 months. Please watch for any sign of bleeding. Please make an appointment to be seen at neurology with Dr. Leonie Man in 6 weeks. Please make an appointment with your PCP to be seen within a week.   Increase activity slowly    Complete by:  As directed       Signed: Lorella Nimrod, MD 08/14/2016, 4:40 PM   Pager: [phone]

## 2016-08-12 NOTE — Evaluation (Signed)
Occupational Therapy Evaluation Patient Details Name: Tracy Norman MRN: [id number] DOB: May 30, 1940 Today's Date: 08/12/2016    History of Present Illness 76 y.o. female with PMHx Significant for hypertension, breast cancer, meningioma and CVA Came to ED with complaint of worsening slurred speech and right-sided weakness for a few days; MRI postive for acute mid pontine infarct   Clinical Impression   PTA Pt independent in ADL/IADL and mobility with RW/quad cane. Pt currently able to perform ADL but fatigues quickly and fine motor skills are decreased. Please see OT problem list below. HEP provided for RUE (focus on theraputty activities and compensatory strategies/AD). Pt will benefit from skilled OT in the acute setting and HHOT follow up to maximize safety and independence in ADL and functional transfers as well as focus on RUE (hand) strengthening, coordination, and proprioception.    Follow Up Recommendations  Home health OT    Equipment Recommendations  None recommended by OT    Recommendations for Other Services       Precautions / Restrictions Precautions Precautions: Fall Restrictions Weight Bearing Restrictions: No      Mobility Bed Mobility Overal bed mobility: Modified Independent             General bed mobility comments: increased time required, use of bed rails to assist  Transfers Overall transfer level: Needs assistance Equipment used: Rolling walker (2 wheeled) Transfers: Sit to/from Stand Sit to Stand: Min guard         General transfer comment: increased time and effort to perform, use of chair arm rail and UE support. no physical assist required. min guard for safety    Balance Overall balance assessment: Needs assistance Sitting-balance support: No upper extremity supported;Feet supported Sitting balance-Leahy Scale: Good Sitting balance - Comments: able to don/doff socks sitting EOB   Standing balance support: Bilateral upper extremity  supported;During functional activity Standing balance-Leahy Scale: Fair Standing balance comment: able to release UE support for brief periods                           ADL either performed or assessed with clinical judgement   ADL Overall ADL's : Modified independent                                       General ADL Comments: Pt requires increased time, able to participate but fatigues quickly      Vision Baseline Vision/History: No visual deficits Patient Visual Report: No change from baseline       Perception     Praxis      Pertinent Vitals/Pain Pain Assessment: Faces Faces Pain Scale: Hurts little more Pain Location: bilateral LE arthritic pain Pain Descriptors / Indicators: Discomfort Pain Intervention(s): Limited activity within patient's tolerance;Monitored during session;Repositioned     Hand Dominance Right   Extremity/Trunk Assessment Upper Extremity Assessment Upper Extremity Assessment: RUE deficits/detail RUE Deficits / Details: RUE functional ROM - able to make gross grasp, and full composite extension, touch back of head, and lower back. Writing is still hard for Pt but able to perform finger to thumb exercises. No ataxic movement during reaching. RUE Sensation: decreased proprioception RUE Coordination: decreased fine motor   Lower Extremity Assessment Lower Extremity Assessment: Defer to PT evaluation   Cervical / Trunk Assessment Cervical / Trunk Assessment: Other exceptions Cervical / Trunk Exceptions: rounded shoulders and forward head  Communication Communication Communication: No difficulties   Cognition Arousal/Alertness: Awake/alert Behavior During Therapy: WFL for tasks assessed/performed Overall Cognitive Status: Within Functional Limits for tasks assessed                                     General Comments       Exercises Exercises: Other exercises Other Exercises Other Exercises:  theraputty - pinch and pull Other Exercises: Theraputty - hiding objects (like pony beads) in putty and using Right hand to find and pinch them out Other Exercises: red tubing and brown tubing provided to enlarge grip to accomodate for decreased grip strength and fatigue   Shoulder Instructions      Home Living Family/patient expects to be discharged to:: Private residence Living Arrangements: Alone Available Help at Discharge: Family;Available PRN/intermittently Type of Home: House Home Access: Stairs to enter [employer] of Steps: 2 Entrance Stairs-Rails: None Home Layout: One level     Bathroom Shower/Tub: Occupational psychologist: Standard Bathroom Accessibility: Yes How Accessible: Accessible via walker Home Equipment: [place] - 4 wheels;Cane - quad;Tub bench;Shower seat;Transport chair          Prior Functioning/Environment Level of Independence: Independent with assistive device(s)        Comments: used a RW in the house and quad cane in community; still driving        OT Problem List: Impaired UE functional use;Decreased strength;Decreased activity tolerance;Impaired balance (sitting and/or standing);Decreased coordination;Impaired sensation      OT Treatment/Interventions: Therapeutic exercise;Self-care/ADL training;Therapeutic activities;Patient/family education;Balance training    OT Goals(Current goals can be found in the care plan section) Acute Rehab OT Goals Patient Stated Goal: to go home (brother to help) OT Goal Formulation: With patient Time For Goal Achievement: 08/19/16 Potential to Achieve Goals: Good ADL Goals Pt Will Transfer to Toilet: with modified independence;ambulating;regular height toilet (with RW) Pt Will Perform Toileting - Clothing Manipulation and hygiene: with modified independence;sit to/from stand Pt Will Perform Tub/Shower Transfer: Tub transfer;ambulating;rolling walker Pt/caregiver will Perform Home Exercise  Program: Right Upper extremity;With theraputty;With written HEP provided;Independently  OT Frequency: Min 2X/week   Barriers to D/C:            Co-evaluation              AM-PAC PT "6 Clicks" Daily Activity     Outcome Measure Help from another person eating meals?: None Help from another person taking care of personal grooming?: None Help from another person toileting, which includes using toliet, bedpan, or urinal?: None Help from another person bathing (including washing, rinsing, drying)?: A Little Help from another person to put on and taking off regular upper body clothing?: None Help from another person to put on and taking off regular lower body clothing?: None 6 Click Score: 23   End of Session Equipment Utilized During Treatment: Gait belt;Rolling walker Nurse Communication: Mobility status  Activity Tolerance: Patient tolerated treatment well Patient left: in chair;with call bell/phone within reach;with chair alarm set  OT Visit Diagnosis: Unsteadiness on feet (R26.81);Muscle weakness (generalized) (M62.81);Hemiplegia and hemiparesis Hemiplegia - Right/Left: Right Hemiplegia - dominant/non-dominant: Dominant Hemiplegia - caused by: Cerebral infarction                Time: 1660-6301 OT Time Calculation (min): 17 min Charges:  OT General Charges $OT Visit: 1 Procedure OT Evaluation $OT Eval Moderate Complexity: 1 Procedure G-Codes:  Hulda Humphrey OTR/L [place] 08/12/2016, 1:59 PM

## 2016-08-12 NOTE — Progress Notes (Signed)
Subjective:  Tracy Norman had NAONE and reports no change in her right sided weakness or dysarthria.  PT and OT came by and worked with on transfers and ambulation and recommended home health PT and discharge. Neurology will follow up and determine whether she is ready for discharge.     Objective: Vital signs in last 24 hours: Vitals:   08/12/16 0130 08/12/16 0330 08/12/16 0559 08/12/16 0920  BP: (!) 136/42 (!) 105/46 (!) 129/47 (!) 120/40  Pulse: 70 72 65 93  Resp: 18   18  Temp: 97.9 F (36.6 C) 97.5 F (36.4 C)  97.9 F (36.6 C)  TempSrc: Oral Oral  Oral  SpO2: 95% 96%  94%   Weight change:  No intake or output data in the 24 hours ending 08/12/16 1151 General appearance: alert, cooperative and no distress Lungs: clear to auscultation bilaterally Heart: regular rate and rhythm, S1, S2 normal, no murmur, click, rub or gallop Extremities: Mild lower extremity edema with mild tenderness in the left calf  Neurologic: Alert and oriented X 3, normal strength and tone. Normal symmetric reflexes. Normal coordination and gait Cranial nerves: normal Motor: Normal except for 4+ grip strength in right hand Coordination: normal Lab Results: CMP Latest Ref Rng & Units 08/12/2016 08/11/2016 08/11/2016  Glucose 65 - 99 mg/dL 96 98 100(H)  BUN 6 - 20 mg/dL 25(H) 32(H) 23(H)  Creatinine 0.44 - 1.00 mg/dL 1.13(H) 1.10(H) 1.17(H)  Sodium 135 - 145 mmol/L 136 139 139  Potassium 3.5 - 5.1 mmol/L 3.6 4.4 4.6  Chloride 101 - 111 mmol/L 100(L) 102 101  CO2 22 - 32 mmol/L 23 - 23  Calcium 8.9 - 10.3 mg/dL 9.6 - 10.8(H)  Total Protein 6.5 - 8.1 g/dL - - 8.0  Total Bilirubin 0.3 - 1.2 mg/dL - - 1.3(H)  Alkaline Phos 38 - 126 U/L - - 60  AST 15 - 41 U/L - - 38  ALT 14 - 54 U/L - - 18   CBC Latest Ref Rng & Units 08/11/2016 08/11/2016 02/17/2014  WBC 4.0 - 10.5 K/uL - 7.4 6.9  Hemoglobin 12.0 - 15.0 g/dL 16.0(H) 16.0(H) 14.6  Hematocrit 36.0 - 46.0 % 47.0(H) 48.0(H) 45.0  Platelets 150 - 400 K/uL -  245 230    Micro Results: No results found for this or any previous visit (from the past 240 hour(s)). Studies/Results: Ct Angio Head W Or Wo Contrast  Result Date: 08/11/2016 CLINICAL DATA:  Follow-up stroke. EXAM: CT ANGIOGRAPHY HEAD TECHNIQUE: Multidetector CT imaging of the head was performed using the standard protocol during bolus administration of intravenous contrast. Multiplanar CT image reconstructions and MIPs were obtained to evaluate the vascular anatomy. CONTRAST:  50 cc Isovue 370 intravenous COMPARISON:  Brain MRI from earlier today FINDINGS: CT HEAD Brain: Acute left pontine infarct by MRI is not clearly visible. No evidence of interval infarct or hemorrhage. Microvascular ischemic change in the cerebral white matter. No 9 mm hemangioma, left parafalcine at the vertex. Vascular: See below Skull: No acute or aggressive finding Sinuses: Negative Orbits: Bilateral cataract resection. CTA HEAD Anterior circulation: Heavy atherosclerotic calcification of the carotid siphons with intermittent mild narrowing. No focal or flow limiting stenosis noted. 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 No major branch occlusion noted. Negative for aneurysm. Posterior circulation: Heavy calcification of the bilateral vertebral arteries with moderate to advanced stenosis on the right just beyond the dura. Mild mid basilar narrowing. No evidence of basilar dissection or high-grade stenosis. Hypoplastic left P1 segment.  Symmetric opacification of the mildly irregular PCAs. Venous sinuses: Patent Anatomic variants: None other than described above Delayed phase: Enhancing meningioma, left parafalcine at the vertex measuring At 9 mm. IMPRESSION: 1. No acute arterial finding. 2. Intracranial atherosclerosis with notably extensive plaque on the bilateral V4 segments causing moderate to advanced narrowing on the right. Electronically Signed   By: Monte Fantasia M.D.   On: 08/11/2016 19:21   Tracy Norman And Wo  Contrast  Result Date: 08/11/2016 CLINICAL DATA:  Slurred speech and RIGHT-sided weakness for 4 days. History of meningioma. Brain tumor. EXAM: MRI HEAD WITHOUT AND WITH CONTRAST TECHNIQUE: Multiplanar, multiecho pulse sequences of the brain and surrounding structures were obtained without and with intravenous contrast. CONTRAST:  94mL MULTIHANCE GADOBENATE DIMEGLUMINE 529 MG/ML IV SOLN COMPARISON:  MRI brain 04/12/2016. FINDINGS: Brain: There is an acute brainstem infarct on the LEFT, affecting the mid pons. This appears nonhemorrhagic. Developing cytotoxic edema on T2 and FLAIR imaging. No hydrocephalus or extra-axial fluid. Moderate atrophy. Chronic microvascular ischemic change of a mild-to-moderate nature. Post infusion imaging demonstrates interval stability of small LEFT parafalcine meningioma, 8 mm diameter, and small 4 mm meningioma over the convexity, also on the LEFT. The Parafalcine meningioma is adjacent to, but not invading, the superior sagittal sinus. Vascular: Normal flow voids. Skull and upper cervical spine: Normal marrow signal. Sinuses/Orbits: Negative.  BILATERAL cataract extraction. Other: None. IMPRESSION: Acute LEFT paramedian mid pontine infarction, nonhemorrhagic. No evidence for vertebral or basilar thrombosis. Stable small subcentimeter meningiomas, without significant growth from January. Atrophy and small vessel disease. Electronically Signed   By: Staci Righter M.D.   On: 08/11/2016 12:05   Medications: I have reviewed the patient's current medications. Scheduled Meds: . aspirin EC  81 mg Oral Daily  . calcium carbonate  1,250 mg Oral BID WC  . cholecalciferol  800 Units Oral BID  . clopidogrel  75 mg Oral Daily  . enoxaparin (LOVENOX) injection  40 mg Subcutaneous Q24H  . rosuvastatin  20 mg Oral Daily   Continuous Infusions: PRN Meds:. Assessment/Plan: Active Problems:   CVA (cerebral vascular accident) [facility])  Cerebral Vascular Accident (CVA): The patients right  sided weakness and slurred speech due to perceived left sided facial weakness is consistent with a left pontine infarct seen on MRI.  Her CTA of her head showed bilateral V4 segment vertebral plaque which may have been the source of an embolus.   Plan:  If neurology agrees, start on dual therapy of aspirin and plavix to prevent future incidents since she was previously on plavix when she had this stroke.  Due to plaque, we also want to get a lipid panel to see how well her hyperlipidemia is controlled.    Hypertension: The patient currently has a normal BP, and because of her stroke we want to ensure a BP that is high to ensure profusion.   Plan: hold her home dose of lisinopril 40 mg daily for permissive hypertension.  She will likely be discharged soon so we should hold it until discharge   History of meningioma: Her meningiomas continue to be stable on MRI and show no dramatic growth.  They are also not associated with any neurologic symptoms. Plan: Follow up MRI per neurology to follow growth of meningiomas   Hypercalcemia: High Ca++ was noted on yesterdays labs at 10.8 but is now down to 9.6.  This is likely benign and due to her calcium supplementation.  However, it has been consistently high in the past and  makes [place] suspicious for hyperparathyroidism.   Plan: Run PTH to see if there is any hyperparathyroidism    Dispo: If the patient is cleared by neurology she is ready to be discharged this afternoon from our standpoint   This is a Careers information officer Note.  The care of the patient was discussed with Dr. Lynnae January and the assessment and plan formulated with their assistance.  Please see their attached note for official documentation of the daily encounter.   LOS: 1 day   Brendolyn Patty, Medical Student 08/12/2016, 11:51 AM

## 2016-08-12 NOTE — Progress Notes (Addendum)
Subjective: Patient was feeling better and since this morning. She denies any new focal weakness stating her previous weakness stayed the same.  Objective:  Vital signs in last 24 hours: Vitals:   08/12/16 0130 08/12/16 0330 08/12/16 0559 08/12/16 0920  BP: (!) 136/42 (!) 105/46 (!) 129/47 (!) 120/40  Pulse: 70 72 65 93  Resp: 18   18  Temp: 97.9 F (36.6 C) 97.5 F (36.4 C)  97.9 F (36.6 C)  TempSrc: Oral Oral  Oral  SpO2: 95% 96%  94%   Gen. Well-developed, well-nourished, pleasant lady, in no acute distress. Lungs. Clear bilaterally CV. Regular rate and rhythm. Extremities. Trace pedal edema, pulses intact and symmetrical. Neuro. Very mild dysarthria, strength 4+/ 5 at right hand grip, it was 5 / 5 elsewhere. Cranial nerve II through XII intact. Sensation symmetrical and intact bilaterally.  Labs. BMP Latest Ref Rng & Units 08/12/2016 08/11/2016 08/11/2016  Glucose 65 - 99 mg/dL 96 98 100(H)  BUN 6 - 20 mg/dL 25(H) 32(H) 23(H)  Creatinine 0.44 - 1.00 mg/dL 1.13(H) 1.10(H) 1.17(H)  Sodium 135 - 145 mmol/L 136 139 139  Potassium 3.5 - 5.1 mmol/L 3.6 4.4 4.6  Chloride 101 - 111 mmol/L 100(L) 102 101  CO2 22 - 32 mmol/L 23 - 23  Calcium 8.9 - 10.3 mg/dL 9.6 - 10.8(H)   Lipid Panel     Component Value Date/Time   CHOL 101 08/12/2016 1103   TRIG 159 (H) 08/12/2016 1103   HDL 43 08/12/2016 1103   CHOLHDL 2.3 08/12/2016 1103   VLDL 32 08/12/2016 1103   LDLCALC 26 08/12/2016 1103   Phosphorous. 3.9 Hemoglobin A1c. Pending Parathyroid hormone. Pending.  CTA Head. FINDINGS: CT HEAD  Brain: Acute left pontine infarct by MRI is not clearly visible. No evidence of interval infarct or hemorrhage. Microvascular ischemic change in the cerebral white matter. No 9 mm hemangioma, left parafalcine at the vertex.  Vascular: See below  Skull: No acute or aggressive finding  Sinuses: Negative  Orbits: Bilateral cataract resection.  CTA HEAD  Anterior circulation:  Heavy atherosclerotic calcification of the carotid siphons with intermittent mild narrowing. No focal or flow limiting stenosis noted. 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 No major branch occlusion noted. Negative for aneurysm.  Posterior circulation: Heavy calcification of the bilateral vertebral arteries with moderate to advanced stenosis on the right just beyond the dura. Mild mid basilar narrowing. No evidence of basilar dissection or high-grade stenosis. Hypoplastic left P1 segment. Symmetric opacification of the mildly irregular PCAs.  Venous sinuses: Patent  Anatomic variants: None other than described above  Delayed phase: Enhancing meningioma, left parafalcine at the vertex measuring At 9 mm.  IMPRESSION: 1. No acute arterial finding. 2. Intracranial atherosclerosis with notably extensive plaque on the bilateral V4 segments causing moderate to advanced narrowing on the right.  Assessment/Plan:  Tracy Norman an 76 y.o.femalewith PMHx Significant for hypertension, breast cancer, meningioma and CVA Came to ED with complaint of worsening slurred speech and right-sided weakness for a few days.  CVA. Her neurologic exam was unchanged from yesterday. Her CTA was showing extensive intracranial atherosclerosis causing moderate to advanced narrowing intermittently. Most likely the cause of her current stroke. PT  Recommending home health. She was started on aspirin along with Plavix yesterday, we will greatly appreciate neurology recommendations regarding dual antiplatelets. Continue Crestor 20 mg daily, her LDL on lipid profile done today was 26 with HDL of 43.  Hypertension. Currently normotensive, her diastolic blood pressure  need to be rechecked. -We'll keep holding her home dose of lisinopril and Maxzide. With her narrowing due to extensive atherosclerosis her blood pressure should be maintained around 202 to 542 systolic to help maintain a good intracerebral  perfusion.  Mild hypercalcemia. She shows intermittently mild hypercalcemia on her BMPs-it was within normal limits this morning. Her phosphorous levels are within normal limit. We are checking her parathyroid levels. Most likely because of her calcium supplements as phosphorous level remains normal.  History of meningioma. Stable in size on recent MRI. She follow-up with neurosurgery.  Dispo: Can be discharged today after neurology evaluation.   Lorella Nimrod, MD 08/12/2016, 12:22 PM Pager: [phone]

## 2016-08-12 NOTE — Progress Notes (Signed)
  Date: 08/12/2016  Patient name: Tracy Norman  Medical record number: [id number]  Date of birth: [birth year]   I have seen and evaluated Tracy Norman and discussed their care with the Residency Team. Tracy Norman is a 76 year old female with a history of meningioma and a prior CVA. She came to the ED with a chief complaint of right sided upper and lower extremity weakness and dysarthria. The symptoms started about 5 days prior to her presentation and progressively worsened. In the ED, she had an MRI which showed an acute ischemic stroke of the left pons. Prior to admission, she had been on Plavix for secondary stroke prevention. This is then continued this admission.  This morning, she has no specific complaints other than when she will be able to go home. She has not been able to write with her right hand. She slept well and had no difficulty eating.  PMHx, Fam Hx, and/or Soc Hx : Other past medical history includes breast cancer and hypertension. Her family history is positive for stroke and coronary artery disease. She lives alone and is a former smoker.  Vitals:   08/12/16 1257 08/12/16 1309  BP: (!) 138/47   Pulse: 76 68  Resp:  18  Temp:  98.8 F (37.1 C)  Tman 98.8 RR 18 Gen. no acute distress HRrR no MRG LCTAB with good air flow Ext no edema Neuro per Tracy 3 [name] ; slight mouth. Otherwise nl  I indep viewed her EKG : nl sinus, nl axis, no ischemic changes  Assessment and Plan: I have seen and evaluated the patient as outlined above. I agree with the formulated Assessment and Plan as detailed in the residents' note, with the following changes:   1. Acute ischemic stroke of the L pons - this explains her sxs or dysarthria and R ext weakness. This event occurred while on Plavix - we will clarify neurology antiplatelet therapy at discharge. She is on a statin. She will need home OT and PT at discharge. Her antihypertensives were held for permissive  hypertension.  Other problems per Dr. Latina Craver progress note.   Bartholomew Crews, MD 5/5/20182:56 PM

## 2016-08-12 NOTE — Care Management Note (Signed)
Case Management Note  Patient Details  Name: Tracy Norman MRN: [id number] Date of Birth: 11/10/1940  Subjective/Objective:                  complaint of right sided upper and lower extremity weakness and dysarthria Action/Plan: Discharge planning Expected Discharge Date:  08/12/16               Expected Discharge Plan:  [place]  In-House Referral:     Discharge planning Services  CM Consult  Post Acute Care Choice:  Home Health Choice offered to:  Patient  DME Arranged:  N/A DME Agency:  NA  HH Arranged:  OT, PT, Speech Therapy HH Agency:  [facility]  Status of Service:  Completed, signed off  If discussed at [place] of Stay Meetings, dates discussed:    Additional Comments: CM spoke with pt for choice of home health agency. Pt chooses [facility] to render HHPT/OT/SLP.  Referral called to [facility] rep, Jermaine. Pt states she has all DME needed at home. NO other CM needs were communicated. Dellie Catholic, RN 08/12/2016, 4:33 PM

## 2016-08-13 LAB — HEMOGLOBIN A1C
Hgb A1c MFr Bld: 4.8 % (ref 4.8–5.6)
MEAN PLASMA GLUCOSE: 91 mg/dL

## 2016-08-14 LAB — PARATHYROID HORMONE, INTACT (NO CA): PTH: 8 pg/mL — ABNORMAL LOW (ref 15–65)

## 2016-08-17 ENCOUNTER — Encounter: Payer: Medicare Other | Admitting: Nurse Practitioner

## 2016-08-18 ENCOUNTER — Telehealth: Payer: Self-pay | Admitting: *Deleted

## 2016-08-18 ENCOUNTER — Encounter: Payer: Self-pay | Admitting: *Deleted

## 2016-08-18 NOTE — Telephone Encounter (Signed)
Spoke with Dr. Fuller Plan about patient's latest stroke.  He stated to cancel her colonoscopy, and to have the patient see him in the office in 3-4 months.   Marvetta Gibbons, RN will call patient and make appointment.

## 2016-08-18 NOTE — Telephone Encounter (Signed)
Spoke with patient. She has already cancelled her colonoscopy. I notified patient that Dr.Stark wants to see her in the office in 3-4 months. She did not want to make the appointment today. She states she will call us back in about a month to make the appointment. She states she's a little "fuzzy".

## 2016-08-21 ENCOUNTER — Telehealth: Payer: Self-pay | Admitting: Neurology

## 2016-08-21 NOTE — Telephone Encounter (Signed)
Rn call Judeen Hammans at Dr. Estill Bamberg office. Judeen Hammans stated the pt was discharge last week,and was placed back on aspirin. Pt was already taking plavix. Rn stated per the discharge note from the hospital,and Dr. Erlinda Hong pt is to remain on aspirin,and plavix for 3 months.  Judeen Hammans wanted to make sure Dr. Leonie Man was fine with this. Rn stated Dr. Erlinda Hong saw patient in the hospital. Pt was seen by Dr. Leonie Man in April 2018 for office visit. Rn stated pt will have to call for a follow up hospital follow up. Pt was seen by her PCP for a office visit. Judeen Hammans verbalized understanding that patient will be taking aspirin/plavix together for 3 months.

## 2016-08-21 NOTE — Telephone Encounter (Signed)
Sherry @ Dr. Deboraha Sprang office is calling to see if it is alright that the patient is taking aspirin EC 325 MG EC tablet and aspirin EC 325 MG EC tablet.

## 2016-08-22 ENCOUNTER — Encounter: Payer: Medicare Other | Admitting: Gastroenterology

## 2016-10-10 ENCOUNTER — Ambulatory Visit (INDEPENDENT_AMBULATORY_CARE_PROVIDER_SITE_OTHER): Payer: Medicare Other | Admitting: Neurology

## 2016-10-10 ENCOUNTER — Encounter: Payer: Self-pay | Admitting: Neurology

## 2016-10-10 VITALS — BP 130/62 | HR 65 | Ht 59.5 in | Wt 172.4 lb

## 2016-10-10 DIAGNOSIS — Z8673 Personal history of transient ischemic attack (TIA), and cerebral infarction without residual deficits: Secondary | ICD-10-CM | POA: Diagnosis not present

## 2016-10-10 DIAGNOSIS — I639 Cerebral infarction, unspecified: Secondary | ICD-10-CM

## 2016-10-10 NOTE — Progress Notes (Signed)
[facility] [address] [place]. [place] [address] [phone]       OFFICE FOLLOW UP VISIT NOTE  Tracy. Tracy Norman Date of Birth:  Feb 06, 1941 Medical Record Number:  [id number]   Referring MD:  Derinda Late Reason for Referral:  Stroke  HPI: Initial Consult 06/06/2016 ; Tracy Norman is a 15 year pleasant Caucasian lady who states that in November 2017 she developed sudden onset of speech difficulties, slurred speech and right hand weakness and clumsiness. This lasted for couple of days. She did not seek immediate medical help. She has had transient worsening of the same symptoms on a number of occasions since then but is unable to exactly pinpoint and tell me that triggers. She saw her primary physician Dr. 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 Incidental small 8 x 8 mm left frontal parasagittal as well as a smaller 4 mm or anterior meningiomas are noted. Patient was referred to neurosurgeon Dr. home she saw advised no surgical treatment at the present time. Patient states she recently had labwork done in the primary physician's office and  Lipid profile was okay but I do not have those results to look at. She was previously on aspirin which was changed recently to Plavix which is starting well without bleeding but does get minor bruising. She is tolerating Lipitor well without muscle aches and pains. She states her blood pressure is usually well controlled though it is elevated today at 170/75 in office. She has mild gait difficulties but she blames this on her osteoarthritis for which she has been refusing surgery. She has not had any further stroke related outpatient workup done including echo echocardiogram and Doppler studies. She denies any known prior history of strokes TIAs, significant head injury, loss of  consciousness, seizures or migraines.. He does not smoke or drink. Is not able to be physically active due to her osteoarthritis and knee pain Update 08/02/2016 ;  She returns for follow-up after initial consultation 2 months ago. She states she'll doing well without recurrent stroke or TIA symptoms. Related to Plavix with only minor bruising but no bleeding. Blood pressure usually at home this well controlled the rate is slightly elevated at 153/69 office. He is tolerating Crestor well without muscle aches or pains last lipid profile was satisfactory. She had transthoracic echocardiogram done on 07/26/16 showed normal ejection fraction without cardiac source of embolism. Carotid ultrasound on 07/14/16 showed no significant extracranial stenosis    . Patient states she is watching her diet but she had not been quite active arthritis pain. She plans to be more active Update 10/10/2016 : She returns for follow-up after last visit 2 months ago. She was readmitted to the hospital on 08/11/16 with new onset of slurred speech and right-sided weakness and gait ataxia. MRI scan of the brain showed a left paramedian pontine infarct. It also showed stable appearance of the small parasagittal meningioma. I have personally reviewed imaging films. CT angiogram of the brain showed diffuse intracranial atherosclerosis including terminal vertebral arteries. Hemoglobin A1c was 4.8. LDL cholesterol was 26 mg percent, triglycerides 159 total cholesterol 101 mg percent. She was already on aspirin 81 mg and the dose was increased to 325 mg and Plavix 75 mg was continued. She states is tolerating both medications but has developed increased bruising. She has finished physical and occupation therapy  at home. She is able to walk with a cane but feels her balance is still off. Her speech has improved right-sided strength seems better but her writing is still pretty bad. She has had no falls. She saw her primary physician Dr. Sandi Mariscal last month  who checked a follow-up lipid profile on June 1 and it was satisfactory. She has no new complaints today. ROS:   14 system review of systems is positive for   , easy bruising, joint pain, speech difficulty, muscle cramps, walking difficulty, skin moles  and all other systems negative  PMH:  Past Medical History:  Diagnosis Date  . Arthritis   . Brain tumor ([place])   . Breast cancer ([place])   . Cataracts, bilateral   . Cervical cancer ([place])   . Colon polyps   . Dysarthria   . Eczema   . Hyperlipidemia   . Hypertension   . Liver disease   . Osteopenia   . Peripheral vascular disease ([place])   . Stroke ([place])   . Venous insufficiency   . Vision abnormalities     Social History:  Social History   Social History  . Marital status: Widowed    Spouse name: N/A  . Number of children: 0  . Years of education: N/A   Occupational History  . retired    Social History Main Topics  . Smoking status: Former Smoker    Quit date: 09/25/1978  . Smokeless tobacco: Never Used  . Alcohol use 0.6 oz/week    1 Glasses of wine per week     Comment: occasional  . Drug use: No  . Sexual activity: Not on file   Other Topics Concern  . Not on file   Social History Narrative  . No narrative on file    Medications:   Current Outpatient Prescriptions on File Prior to Visit  Medication Sig Dispense Refill  . alendronate (FOSAMAX) 70 MG tablet Take 70 mg by mouth once a week. Take with a full glass of water on an empty stomach.    Marland Kitchen aspirin EC 325 MG EC tablet Take 1 tablet (325 mg total) by mouth daily. 30 tablet 0  . Cholecalciferol (VITAMIN D PO) Take 1,000 Units by mouth daily.     . clopidogrel (PLAVIX) 75 MG tablet Take 75 mg by mouth daily.     Marland Kitchen lisinopril (PRINIVIL,ZESTRIL) 40 MG tablet Take 40 mg by mouth daily.     . rosuvastatin (CRESTOR) 20 MG tablet Take 20 mg by mouth daily.     No current facility-administered medications on file prior to visit.     Allergies:   Allergies    Allergen Reactions  . Penicillins Hives    All over the body.    Physical Exam General: Obese elderly Caucasian lady seated, in no evident distress Head: head normocephalic and atraumatic.   Neck: supple with no carotid or supraclavicular bruits Cardiovascular: regular rate and rhythm, no murmurs Musculoskeletal: no deformity Skin:  no rash/petichiae Vascular:  Normal pulses all extremities  Neurologic Exam Mental Status: Awake and fully alert. Oriented to place and time. Recent and remote memory intact. Attention span, concentration and fund of knowledge appropriate. Mood and affect appropriate.  Cranial Nerves: Fundoscopic exam Not done  .  qual, briskly reactive to light. Extraocular movements full without nystagmus. Visual fields full to confrontation. Hearing intact. Facial sensation intact. Face, tongue, palate moves normally and symmetrically.  Motor: Normal bulk and tone. Normal strength in all tested  extremity muscles.Diminished fine finger movements on the right. Orbits left-to-right approximately. Minimum right grip and ankle dorsiflexorweakness. Sensory.: intact to touch , pinprick , position and vibratory sensation.  Coordination: Rapid alternating movements normal in all extremities. Finger-to-nose and heel-to-shin performed accurately bilaterally. Gait and Station: Arises from chair with  difficulty. Stance is stooped. Gait demonstrates normal stride length and balance but favors left leg due to knee pain. . Unable to heel, toe and tandem walk without difficulty. Unable to stand on right foot unsupported. Reflexes: 1+ and symmetric. Toes downgoing.      ASSESSMENT:  28 year Caucasian lady with transient speech difficulties and right hand weakness in November 2017 likely due to right pontine lacunar infarct from small vessel disease. Incidental 2 small parasagittal meningiomas. Vascular risk factors of hypertension hyperlipidemia obesity and age. 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 I also advised the patient to eat a healthy diet with plenty of whole grains, cereals, fruits and vegetables, exercise regularly and maintain ideal body weight. I advised her to use a cane at all times and fall and safety precautions. Followup in the future with my nurse practitioner in 6 months or call earlier if necessary Continue conservative follow-up for her small incidental meningiomas. Greater than 50% time during this 30 minute visit was spent on counseling and coordination of care about her lacunar infarct, small meningiomas and answered questions Followup in the future with my nurse practitioner in 6 months or call earlier if necessary Antony Contras, MD  [facility] [address] [place], [place] [address]  Phone [phone] Fax [phone] Note: This document was prepared with digital dictation and possible smart phrase technology. Any transcriptional errors that result from this process are unintentional.

## 2016-10-10 NOTE — Patient Instructions (Signed)
I had a long d/w patient about her recent Pontine stroke, risk for recurrent stroke/TIAs, personally independently reviewed imaging studies and stroke evaluation results and answered questions.Continue aspirin 325 mg daily and Plavix 75 mg daily  for  1 more month and then stop aspirin and stay on Plavix alone forsecondary stroke prevention and maintain strict control of hypertension with blood pressure goal below 130/90, diabetes with hemoglobin A1c goal below 6.5% and lipids with LDL cholesterol goal below 70 mg/dL. I also advised the patient to eat a healthy diet with plenty of whole grains, cereals, fruits and vegetables, exercise regularly and maintain ideal body weight. I advised her to use a cane at all times and fall and safety precautions. Followup in the future with my nurse practitioner in 6 months or call earlier if necessary Fall Prevention in the [place] can cause injuries and can affect people from all age groups. There are many simple things that you can do to make your home safe and to help prevent falls. What can I do on the outside of my home?  Regularly repair the edges of walkways and driveways and fix any cracks.  Remove high doorway thresholds.  Trim any shrubbery on the main path into your home.  Use bright outdoor lighting.  Clear walkways of debris and clutter, including tools and rocks.  Regularly check that handrails are securely fastened and in good repair. Both sides of any steps should have handrails.  Install guardrails along the edges of any raised decks or porches.  Have leaves, snow, and ice cleared regularly.  Use sand or salt on walkways during winter months.  In the garage, clean up any spills right away, including grease or oil spills. What can I do in the bathroom?  Use night lights.  Install grab bars by the toilet and in the tub and shower. Do not use towel bars as grab bars.  Use non-skid mats or decals on the floor of the tub or  shower.  If you need to sit down while you are in the shower, use a plastic, non-slip stool.  Keep the floor dry. Immediately clean up any water that spills on the floor.  Remove soap buildup in the tub or shower on a regular basis.  Attach bath mats securely with double-sided non-slip rug tape.  Remove throw rugs and other tripping hazards from the floor. What can I do in the bedroom?  Use night lights.  Make sure that a bedside light is easy to reach.  Do not use oversized bedding that drapes onto the floor.  Have a firm chair that has side arms to use for getting dressed.  Remove throw rugs and other tripping hazards from the floor. What can I do in the kitchen?  Clean up any spills right away.  Avoid walking on wet floors.  Place frequently used items in easy-to-reach places.  If you need to reach for something above you, use a sturdy step stool that has a grab bar.  Keep electrical cables out of the way.  Do not use floor polish or wax that makes floors slippery. If you have to use wax, make sure that it is non-skid floor wax.  Remove throw rugs and other tripping hazards from the floor. What can I do in the stairways?  Do not leave any items on the stairs.  Make sure that there are handrails on both sides of the stairs. Fix handrails that are broken or loose. Make sure that handrails  are as long as the stairways.  Check any carpeting to make sure that it is firmly attached to the stairs. Fix any carpet that is loose or worn.  Avoid having throw rugs at the top or bottom of stairways, or secure the rugs with carpet tape to prevent them from moving.  Make sure that you have a light switch at the top of the stairs and the bottom of the stairs. If you do not have them, have them installed. What are some other fall prevention tips?  Wear closed-toe shoes that fit well and support your feet. Wear shoes that have rubber soles or low heels.  When you use a  stepladder, make sure that it is completely opened and that the sides are firmly locked. Have someone hold the ladder while you are using it. Do not climb a closed stepladder.  Add color or contrast paint or tape to grab bars and handrails in your home. Place contrasting color strips on the first and last steps.  Use mobility aids as needed, such as canes, walkers, scooters, and crutches.  Turn on lights if it is dark. Replace any light bulbs that burn out.  Set up furniture so that there are clear paths. Keep the furniture in the same spot.  Fix any uneven floor surfaces.  Choose a carpet design that does not hide the edge of steps of a stairway.  Be aware of any and all pets.  Review your medicines with your healthcare provider. Some medicines can cause dizziness or changes in blood pressure, which increase your risk of falling. Talk with your health care provider about other ways that you can decrease your risk of falls. This may include working with a physical therapist or trainer to improve your strength, balance, and endurance. This information is not intended to replace advice given to you by your health care provider. Make sure you discuss any questions you have with your health care provider. Document Released: 03/17/2002 Document Revised: 08/24/2015 Document Reviewed: 05/01/2014 Elsevier Interactive Patient Education  2017 [employer].

## 2016-12-21 ENCOUNTER — Telehealth: Payer: Self-pay | Admitting: Adult Health

## 2016-12-21 ENCOUNTER — Encounter: Payer: Self-pay | Admitting: Adult Health

## 2016-12-21 ENCOUNTER — Ambulatory Visit (HOSPITAL_BASED_OUTPATIENT_CLINIC_OR_DEPARTMENT_OTHER): Payer: Medicare Other | Admitting: Adult Health

## 2016-12-21 VITALS — BP 131/58 | HR 94 | Temp 98.4°F | Resp 17 | Ht 59.5 in | Wt 166.2 lb

## 2016-12-21 DIAGNOSIS — Z170 Estrogen receptor positive status [ER+]: Principal | ICD-10-CM

## 2016-12-21 DIAGNOSIS — Z8673 Personal history of transient ischemic attack (TIA), and cerebral infarction without residual deficits: Secondary | ICD-10-CM | POA: Diagnosis not present

## 2016-12-21 DIAGNOSIS — Z853 Personal history of malignant neoplasm of breast: Secondary | ICD-10-CM | POA: Diagnosis present

## 2016-12-21 DIAGNOSIS — C50412 Malignant neoplasm of upper-outer quadrant of left female breast: Secondary | ICD-10-CM

## 2016-12-21 NOTE — Telephone Encounter (Signed)
Spoke with patient about appts. Did not want avs or calendar.  °

## 2016-12-21 NOTE — Progress Notes (Signed)
CLINIC:  Survivorship   REASON FOR VISIT:  Routine follow-up for history of breast cancer.   BRIEF ONCOLOGIC HISTORY:    Breast cancer of upper-outer quadrant of left female breast ([place])   03/15/2010 Surgery    left Lumpectomy with sentinel node biopsy for Left breast cancer on 03/15/2010. T1 C. N0 ER positive75% PR +10% HER-2/neu negative       Radiation Therapy     patient refused radiation therapy      04/11/2010 - 08/19/2015 Anti-estrogen oral therapy     Arimidex 1 mg daily 5 years        INTERVAL HISTORY:  Tracy Norman presents to the [facility] today for routine follow-up for her history of breast cancer.  Overall, she reports feeling quite well. She has unfortunately had two strokes since her last visit.  She has continued with mammograms following her left breast closely.  We do not have record of these.  She has declined biopsy of this area in the past.  She said her most recent abnormality the mammogram accompanied ultrasound.      REVIEW OF SYSTEMS:  Review of Systems  Constitutional: Negative for appetite change, chills, fatigue, fever and unexpected weight change.  HENT:   Negative for hearing loss and lump/mass.   Eyes: Negative for eye problems and icterus.  Respiratory: Negative for chest tightness, cough and shortness of breath.   Cardiovascular: Negative for chest pain, leg swelling and palpitations.  Gastrointestinal: Negative for abdominal distention, abdominal pain, constipation, diarrhea, nausea and vomiting.  Endocrine: Negative for hot flashes.  Skin: Negative for itching and rash.  Neurological: Negative for dizziness, extremity weakness, headaches and numbness.  Hematological: Negative for adenopathy. Does not bruise/bleed easily.  Psychiatric/Behavioral: Negative for depression. The patient is not nervous/anxious.   Breast: Denies any new nodularity, masses, tenderness, nipple changes, or nipple discharge.       PAST  MEDICAL/SURGICAL HISTORY:  Past Medical History:  Diagnosis Date  . Arthritis   . Brain tumor ([place])   . Breast cancer ([place])   . Cataracts, bilateral   . Cervical cancer ([place])   . Colon polyps   . Dysarthria   . Eczema   . Hyperlipidemia   . Hypertension   . Liver disease   . Osteopenia   . Peripheral vascular disease ([place])   . Stroke ([place])   . Venous insufficiency   . Vision abnormalities    Past Surgical History:  Procedure Laterality Date  . BREAST LUMPECTOMY Left 03/15/2010  . CATARACT EXTRACTION W/ INTRAOCULAR LENS  IMPLANT, BILATERAL Bilateral   . CERVICAL CONIZATION W/BX    . TONSILLECTOMY       ALLERGIES:  Allergies  Allergen Reactions  . Penicillins Hives    All over the body.     CURRENT MEDICATIONS:  Outpatient Encounter Prescriptions as of 12/21/2016  Medication Sig Note  . alendronate (FOSAMAX) 70 MG tablet Take 70 mg by mouth once a week. Take with a full glass of water on an empty stomach. 08/11/2016: Sundays  . Cholecalciferol (VITAMIN D PO) Take 1,000 Units by mouth daily.    . clopidogrel (PLAVIX) 75 MG tablet Take 75 mg by mouth daily.    Marland Kitchen lisinopril (PRINIVIL,ZESTRIL) 40 MG tablet Take 40 mg by mouth daily.    . potassium chloride (K-DUR) 10 MEQ tablet 60 mEq.    . rosuvastatin (CRESTOR) 20 MG tablet Take 20 mg by mouth daily.   Marland Kitchen triamterene-hydrochlorothiazide (MAXZIDE-25) 37.5-25 MG tablet Take 1  tablet by mouth daily.   . [DISCONTINUED] aspirin EC 325 MG EC tablet Take 1 tablet (325 mg total) by mouth daily. (Patient not taking: Reported on 12/21/2016)    No facility-administered encounter medications on file as of 12/21/2016.      ONCOLOGIC FAMILY HISTORY:  Family History  Problem Relation Age of Onset  . Hypertension Mother   . Osteoarthritis Mother   . Heart disease Father        before age 54  . Esophageal cancer Father        mets  . Hypertension Father   . Stroke Brother   . Heart attack Brother   . Hypertension Brother   .  Hypertension Brother   . Other Paternal Grandmother        hardening of the arteries  . Heart attack Paternal Grandfather 84    GENETIC COUNSELING/TESTING: Not indicated at this time  SOCIAL HISTORY:  Tracy Norman is widowed and lives alone in [place], [place]. Tracy Norman is currently retired.  She denies any current or history of tobacco, alcohol, or illicit drug use.     PHYSICAL EXAMINATION:  Vital Signs: Vitals:   12/21/16 1312  BP: (!) 131/58  Pulse: 94  Resp: 17  Temp: 98.4 F (36.9 C)  SpO2: 100%   Filed Weights   12/21/16 1312  Weight: 166 lb 3.2 oz (75.4 kg)   General: Well-nourished, well-appearing female in no acute distress.  Unaccompanied today.   HEENT: Head is normocephalic.  Pupils equal and reactive to light. Conjunctivae clear without exudate.  Sclerae anicteric. Oral mucosa is pink, moist.  Oropharynx is pink without lesions or erythema.  Lymph: No cervical, supraclavicular, or infraclavicular lymphadenopathy noted on palpation.  Cardiovascular: Regular rate and rhythm.Marland Kitchen Respiratory: Clear to auscultation bilaterally. Chest expansion symmetric; breathing non-labored.  Breast Exam:  Declined breast exam  Axilla: No axillary adenopathy bilaterally.  GI: Abdomen soft and round; non-tender, non-distended. Bowel sounds normoactive. No hepatosplenomegaly.   GU: Deferred.  Neuro: No focal deficits. Steady gait.  Psych: Mood and affect normal and appropriate for situation.  MSK: No focal spinal tenderness to palpation, full range of motion in bilateral upper extremities Extremities: No edema. Skin: Warm and dry.  LABORATORY DATA:  None for this visit   DIAGNOSTIC IMAGING:  Most recent mammogram: requested from [facility]:  Tracy Norman is a pleasant 76 y.o. female with history of Stage IA left breast invasive ductal carcinoma, ER+/PR+/HER2-, diagnosed in 03/2010, treated with lumpectomy, adjuvant radiation therapy, and  anti-estrogen therapy with Letrozole x 5 years, completing therapy in 08/2015.  She [facility] for surveillance and routine follow-up.   1. History of breast cancer:  Tracy Norman is currently clinically and radiographically without evidence of disease or recurrence of breast cancer. She has required frequent mammograms at [facility] and unfortunately we do not have records of this scanned in our system.  I am uncertain when her next mammogram is due, and have requested records from [facility].  She will return for a LTS follow up in one year.  I encouraged her to call me with any questions or concerns before her next visit at the cancer center, and I would be happy to see her sooner, if needed.    2. Bone health:  Given Tracy Norman age, history of breast cancer, and her previous anti-estrogen therapy with Anastrozole, she is at risk for bone demineralization. Her last DEXA scan was in  2017 and was consistent with osteopenia and osteoporosis.  She is taking Fosamax.  I do not have copies of these results from [facility] and I have requested them today.  In the meantime, she was encouraged to increase her consumption of foods rich in calcium, as well as increase her weight-bearing activities.  She was given education on specific food and activities to promote bone health.  She will be due to repeat bone density testing in 2019.    3. Cancer screening:  Due to Ms. Valletta's history and her age, she should receive screening for skin cancers, colon cancers. She was encouraged to follow-up with her PCP for appropriate cancer screenings.   4. Health maintenance and wellness promotion: Tracy Norman was encouraged to consume 5-7 servings of fruits and vegetables per day. She was also encouraged to engage in moderate to vigorous exercise for 30 minutes per day most days of the week. She was instructed to limit her alcohol consumption and continue to abstain from tobacco use.    Dispo:  -Return to cancer  center in one year for LTS follow up -Mammogram when due per [facility]   A total of (30) minutes of face-to-face time was spent with this patient with greater than 50% of that time in counseling and care-coordination.   Gardenia Phlegm, NP Survivorship Program [facility] [phone]   Note: PRIMARY CARE PROVIDER Derinda Late, [place] [phone]

## 2017-01-05 ENCOUNTER — Ambulatory Visit
Admission: RE | Admit: 2017-01-05 | Discharge: 2017-01-05 | Disposition: A | Discharge status: Home / Self Care | Payer: Medicare Other | Source: Ambulatory Visit | Attending: Family Medicine | Admitting: Family Medicine

## 2017-01-05 ENCOUNTER — Other Ambulatory Visit: Payer: Self-pay | Admitting: Family Medicine

## 2017-01-05 DIAGNOSIS — M545 Low back pain: Secondary | ICD-10-CM

## 2017-02-01 ENCOUNTER — Ambulatory Visit: Payer: Medicare Other | Admitting: Nurse Practitioner

## 2017-04-16 NOTE — Progress Notes (Signed)
[facility]  PATIENT: Tracy Norman DOB: 1940-04-23   REASON FOR VISIT: Follow-up for stroke event January 2018 HISTORY FROM: Patient alone at visit    [place] Consult 2/27/2018PS ; Tracy Norman is a 68 year pleasant Caucasian lady who states that in November 2017 she developed sudden onset of speech difficulties, slurred speech and right hand weakness and clumsiness. This lasted for couple of days. She did not seek immediate medical help. She has had transient worsening of the same symptoms on a number of occasions since then but is unable to exactly pinpoint and tell me that triggers. She saw her primary physician Dr. 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 Incidental small 8 x 8 mm left frontal parasagittal as well as a smaller 4 mm or anterior meningiomas are noted. Patient was referred to neurosurgeon Dr. home she saw advised no surgical treatment at the present time. Patient states she recently had labwork done in the primary physician's office and  Lipid profile was okay but I do not have those results to look at. She was previously on aspirin which was changed recently to Plavix which is starting well without bleeding but does get minor bruising. She is tolerating Lipitor well without muscle aches and pains. She states her blood pressure is usually well controlled though it is elevated today at 170/75 in office. She has mild gait difficulties but she blames this on her osteoarthritis for which she has been refusing surgery. She has not had any further stroke related outpatient workup done including echo echocardiogram and Doppler studies. She denies any known prior history of strokes TIAs, significant head injury, loss of consciousness, seizures or migraines.. He does not smoke or drink. Is not  able to be physically active due to her osteoarthritis and knee pain Update 08/02/2016 PS;  She returns for follow-up after initial consultation 2 months ago. She states she'll doing well without recurrent stroke or TIA symptoms. Related to Plavix with only minor bruising but no bleeding. Blood pressure usually at home this well controlled the rate is slightly elevated at 153/69 office. He is tolerating Crestor well without muscle aches or pains last lipid profile was satisfactory. She had transthoracic echocardiogram done on 07/26/16 showed normal ejection fraction without cardiac source of embolism. Carotid ultrasound on 07/14/16 showed no significant extracranial stenosis    . Patient states she is watching her diet but she had not been quite active arthritis pain. She plans to be more active Update 7/3/2018PS : She returns for follow-up after last visit 2 months ago. She was readmitted to the hospital on 08/11/16 with new onset of slurred speech and right-sided weakness and gait ataxia. MRI scan of the brain showed a left paramedian pontine infarct. It also showed stable appearance of the small parasagittal meningioma. I have personally reviewed imaging films. CT angiogram of the brain showed diffuse intracranial atherosclerosis including terminal vertebral arteries. Hemoglobin A1c was 4.8. LDL cholesterol was 26 mg percent, triglycerides 159 total cholesterol 101 mg percent. She was already on aspirin 81 mg and the dose was increased to 325 mg and Plavix 75 mg was continued. She states is tolerating both medications but has developed increased bruising. She has finished physical and occupation therapy at home. She is able to walk with a cane but feels her balance is still off.  Her speech has improved right-sided strength seems better but her writing is still pretty bad. She has had no falls. She saw her primary physician Dr. Sandi Mariscal last month who checked a follow-up lipid profile on June 1 and it was  satisfactory. She has no new complaints today UPDATE 1/8/2019CM Tracy Norman, 77 year old female returns for follow-up with a history of stroke event found on MRI in January 2018 and readmitted in May with acute onset of slurred speech.  MRI at that time showed left paramedian pontine infarct.  She remains on Plavix for secondary stroke prevention.  She denies further stroke or TIA symptoms she has minimal bruising and no bleeding.  She remains on Crestor which was recently increased by her primary care , Dr.  Sandi Mariscal to 40 mg daily.  Blood pressure in the office today 110/70 she is followed by Dr. Sherwood Gambler for her meningiomas.  She has no longer getting rehab.  She ambulates with a 4 pronged cane.  She has had 2 falls since last seen usually due to moving too fast.  She has a history of osteopenia and osteoarthritis which is worse on the right knee than left.  She has rosacea.  She feels she is back to baseline.  She just saw her primary care for routine physical.  She continues to live at home.  She is independent in all activities of daily living.  She has had no problems with her driving.  She does not exercise and has no intention of doing so.  She does not follow a low-fat diet.  She returns for reevaluation  REVIEW OF SYSTEMS: Full 14 system review of systems performed and notable only for those listed, all others are neg:  Constitutional: neg  Cardiovascular: neg Ear/Nose/Throat: neg  Skin: Rosacea Eyes: neg Respiratory: neg Gastroitestinal: neg  Genitourinary urinary frequency Hematology/Lymphatic: neg  Endocrine: neg Musculoskeletal: Joint pain, gait abnormality,  Allergy/Immunology: neg Neurological: neg Psychiatric: neg Sleep : neg   ALLERGIES: Allergies  Allergen Reactions  . Penicillins Hives    All over the body.    HOME MEDICATIONS: Outpatient Medications Prior to Visit  Medication Sig Dispense Refill  . alendronate (FOSAMAX) 70 MG tablet Take 70 mg by mouth once a  week. Take with a full glass of water on an empty stomach.    . Cholecalciferol (VITAMIN D PO) Take 1,000 Units by mouth daily.     . clopidogrel (PLAVIX) 75 MG tablet Take 75 mg by mouth daily.     Marland Kitchen lisinopril (PRINIVIL,ZESTRIL) 40 MG tablet Take 40 mg by mouth daily.     . potassium chloride (K-DUR) 10 MEQ tablet 60 mEq.     . rosuvastatin (CRESTOR) 40 MG tablet 40 mg daily.    Marland Kitchen triamterene-hydrochlorothiazide (MAXZIDE-25) 37.5-25 MG tablet Take 1 tablet by mouth daily.    . rosuvastatin (CRESTOR) 20 MG tablet Take 20 mg by mouth daily.     No facility-administered medications prior to visit.     PAST MEDICAL HISTORY: Past Medical History:  Diagnosis Date  . Arthritis   . Brain tumor ([place])   . Breast cancer ([place])   . Cataracts, bilateral   . Cervical cancer ([place])   . Colon polyps   . Dysarthria   . Eczema   . Hyperlipidemia   . Hypertension   . Liver disease   . Osteopenia   . Peripheral vascular disease ([place])   . Stroke ([place])   . Venous insufficiency   . Vision abnormalities  PAST SURGICAL HISTORY: Past Surgical History:  Procedure Laterality Date  . BREAST LUMPECTOMY Left 03/15/2010  . CATARACT EXTRACTION W/ INTRAOCULAR LENS  IMPLANT, BILATERAL Bilateral   . CERVICAL CONIZATION W/BX    . TONSILLECTOMY      FAMILY HISTORY: Family History  Problem Relation Age of Onset  . Hypertension Mother   . Osteoarthritis Mother   . Heart disease Father        before age 20  . Esophageal cancer Father        mets  . Hypertension Father   . Stroke Brother   . Heart attack Brother   . Hypertension Brother   . Hypertension Brother   . Other Paternal Grandmother        hardening of the arteries  . Heart attack Paternal Grandfather 51  . Lung cancer Maternal Aunt   . Lymphoma Maternal Aunt     SOCIAL HISTORY: Social History   Socioeconomic History  . Marital status: Widowed    Spouse name: Not on file  . Number of children: 0  . Years of education: Not on  file  . Highest education level: Not on file  Social Needs  . Financial resource strain: Not on file  . Food insecurity - worry: Not on file  . Food insecurity - inability: Not on file  . Transportation needs - medical: Not on file  . Transportation needs - non-medical: Not on file  Occupational History  . Occupation: retired  Tobacco Use  . Smoking status: Former Smoker    Last attempt to quit: 09/25/1978    Years since quitting: 38.5  . Smokeless tobacco: Never Used  Substance and Sexual Activity  . Alcohol use: Yes    Alcohol/week: 0.6 oz    Types: 1 Glasses of wine per week    Comment: occasional  . Drug use: No  . Sexual activity: Not on file  Other Topics Concern  . Not on file  Social History Narrative  . Not on file   PHYSICAL EXAM  Vitals:   04/17/17 0919  BP: 110/70  Pulse: 78  Weight: 166 lb 6.4 oz (75.5 kg)   Body mass index is 33.05 kg/m.  Generalized: Well developed, obese female in no acute distress  Head: normocephalic and atraumatic,. Oropharynx benign  Neck: Supple, no carotid bruits  Cardiac: Regular rate rhythm, no murmur  Musculoskeletal: No deformity  Skin rosacea on the face  Neurological examination   Mentation: Alert oriented to time, place, history taking. Attention span and concentration appropriate. Recent and remote memory intact.  Follows all commands speech and language fluent.   Cranial nerve II-XII: Fundoscopic exam reveals sharp disc margins.Pupils were equal round reactive to light extraocular movements were full, visual field were full on confrontational test. Facial sensation and strength were normal. hearing was intact to finger rubbing bilaterally. Uvula tongue midline. head turning and shoulder shrug were normal and symmetric.Tongue protrusion into cheek strength was normal. Motor: normal bulk and tone, full strength in the BUE, BLE, except mild weakness right lower extremity.  Minimal ankle dorsiflexion weakness.  Sensory:  normal and symmetric to light touch, pinprick, and  Vibration, doing upper and lower extremities Coordination: finger-nose-finger, heel-to-shin bilaterally, no dysmetria, no tremor Reflexes: Brachioradialis 3+, biceps 3+, triceps 3+, patellar 2/2, Achilles 2/2, plantar responses were flexor bilaterally. Gait and Station: Rising up from seated position without assistance, stooped  stance,  moderate stride, favors left leg when ambulating.  Ambulates with a four-point cane.  Unable  to tandem walk.   Romberg negative .  DIAGNOSTIC DATA (LABS, IMAGING, TESTING) - I reviewed patient records, labs, notes, testing and imaging myself where available.  Lab Results  Component Value Date   WBC 7.4 08/11/2016   HGB 16.0 (H) 08/11/2016   HCT 47.0 (H) 08/11/2016   MCV 90.7 08/11/2016   PLT 245 08/11/2016      Component Value Date/Time   NA 136 08/12/2016 0403   NA 142 02/17/2014 1526   K 3.6 08/12/2016 0403   K 3.9 02/17/2014 1526   CL 100 (L) 08/12/2016 0403   CL 106 02/26/2012 1326   CO2 23 08/12/2016 0403   CO2 29 02/17/2014 1526   GLUCOSE 96 08/12/2016 0403   GLUCOSE 98 02/17/2014 1526   GLUCOSE 98 02/26/2012 1326   BUN 25 (H) 08/12/2016 0403   BUN 27.4 (H) 02/17/2014 1526   CREATININE 1.13 (H) 08/12/2016 0403   CREATININE 1.3 (H) 02/17/2014 1526   CALCIUM 9.6 08/12/2016 0403   CALCIUM 10.2 02/17/2014 1526   PROT 8.0 08/11/2016 1041   PROT 7.2 02/17/2014 1526   ALBUMIN 4.5 08/11/2016 1041   ALBUMIN 4.0 02/17/2014 1526   AST 38 08/11/2016 1041   AST 23 02/17/2014 1526   ALT 18 08/11/2016 1041   ALT 21 02/17/2014 1526   ALKPHOS 60 08/11/2016 1041   ALKPHOS 64 02/17/2014 1526   BILITOT 1.3 (H) 08/11/2016 1041   BILITOT 0.41 02/17/2014 1526   GFRNONAA 46 (L) 08/12/2016 0403   GFRAA 53 (L) 08/12/2016 0403   Lab Results  Component Value Date   CHOL 101 08/12/2016   HDL 43 08/12/2016   LDLCALC 26 08/12/2016   TRIG 159 (H) 08/12/2016   CHOLHDL 2.3 08/12/2016   Lab Results    Component Value Date   HGBA1C 4.8 08/12/2016   ASSESSMENT AND PLAN 4 year Caucasian lady with transient speech difficulties and right hand weakness in November 2017 likely due to right pontine lacunar infarct from small vessel disease. Incidental 2 small parasagittal meningiomas. Vascular risk factors of hypertension hyperlipidemia obesity and age. 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 The patient is a current patient of Dr. Leonie Man who is out of the office today . This note is sent to the work in doctor.      PLAN: Stressed the importance of management of risk factors to prevent further stroke Continue Plavix for secondary stroke prevention Maintain strict control of hypertension with blood pressure goal below 130/90, today's reading 110/70 continue antihypertensive medications Control of diabetes with hemoglobin A1c below 6.5 followed by primary care  Cholesterol with LDL cholesterol less than 70, followed by primary care,   continue Crestor Exercise by walking, use cane for safe ambulation   eat healthy diet with whole grains,  fresh fruits and vegetables Continue follow-up with Dr.Nudelman for meningioma management Discharge from stroke clinic  I spent 25  in total face to face time with the patient more than 50% of which was spent counseling and coordination of care, reviewing test results reviewing medications and discussing and reviewing the diagnosis of stroke and management of risk factors.  Given written information as well Dennie Bible, [facility], [facility], APRN  [facility] [address] [place], [place] [address] [phone]

## 2017-04-17 ENCOUNTER — Ambulatory Visit (INDEPENDENT_AMBULATORY_CARE_PROVIDER_SITE_OTHER): Payer: Medicare Other | Admitting: Nurse Practitioner

## 2017-04-17 ENCOUNTER — Encounter (INDEPENDENT_AMBULATORY_CARE_PROVIDER_SITE_OTHER): Payer: Self-pay

## 2017-04-17 ENCOUNTER — Encounter: Payer: Self-pay | Admitting: Nurse Practitioner

## 2017-04-17 VITALS — BP 110/70 | HR 78 | Wt 166.4 lb

## 2017-04-17 DIAGNOSIS — I10 Essential (primary) hypertension: Secondary | ICD-10-CM

## 2017-04-17 DIAGNOSIS — I6381 Other cerebral infarction due to occlusion or stenosis of small artery: Secondary | ICD-10-CM

## 2017-04-17 DIAGNOSIS — D329 Benign neoplasm of meninges, unspecified: Secondary | ICD-10-CM

## 2017-04-17 DIAGNOSIS — E785 Hyperlipidemia, unspecified: Secondary | ICD-10-CM

## 2017-04-17 NOTE — Patient Instructions (Addendum)
Stressed the importance of management of risk factors to prevent further stroke Continue Plavix for secondary stroke prevention Maintain strict control of hypertension with blood pressure goal below 130/90, today's reading 110/70 continue antihypertensive medications Control of diabetes with hemoglobin A1c below 6.5 followed by primary care  Cholesterol with LDL cholesterol less than 70, followed by primary care,   continue Crestor Continue follow-up with Dr. Sherwood Gambler management of meningiomas  exercise by walking, use cane  for safe ambulation   eat healthy diet with whole grains,  fresh fruits and vegetables Discharge from stroke clinic   Stroke Prevention Some medical conditions and behaviors are associated with a higher chance of having a stroke. You can help prevent a stroke by making nutrition, lifestyle, and other changes, including managing any medical conditions you may have. What nutrition changes can be made?  Eat healthy foods. You can do this by: ? Choosing foods high in fiber, such as fresh fruits and vegetables and whole grains. ? Eating at least 5 or more servings of fruits and vegetables a day. Try to fill half of your plate at each meal with fruits and vegetables. ? Choosing lean protein foods, such as lean cuts of meat, poultry without skin, fish, tofu, beans, and nuts. ? Eating low-fat dairy products. ? Avoiding foods that are high in salt (sodium). This can help lower blood pressure. ? Avoiding foods that have saturated fat, trans fat, and cholesterol. This can help prevent high cholesterol. ? Avoiding processed and premade foods.  Follow your health care provider's specific guidelines for losing weight, controlling high blood pressure (hypertension), lowering high cholesterol, and managing diabetes. These may include: ? Reducing your daily calorie intake. ? Limiting your daily sodium intake to 1,500 milligrams (mg). ? Using only healthy fats for cooking, such as olive  oil, canola oil, or sunflower oil. ? Counting your daily carbohydrate intake. What lifestyle changes can be made?  Maintain a healthy weight. Talk to your health care provider about your ideal weight.  Get at least 30 minutes of moderate physical activity at least 5 days a week. Moderate activity includes brisk walking, biking, and swimming.  Do not use any products that contain nicotine or tobacco, such as cigarettes and e-cigarettes. If you need help quitting, ask your health care provider. It may also be helpful to avoid exposure to secondhand smoke.  Limit alcohol intake to no more than 1 drink a day for nonpregnant women and 2 drinks a day for men. One drink equals 12 oz of beer, 5 oz of wine, or 1 oz of hard liquor.  Stop any illegal drug use.  Avoid taking birth control pills. Talk to your health care provider about the risks of taking birth control pills if: ? You are over 73 years old. ? You smoke. ? You get migraines. ? You have ever had a blood clot. What other changes can be made?  Manage your cholesterol levels. ? Eating a healthy diet is important for preventing high cholesterol. If cholesterol cannot be managed through diet alone, you may also need to take medicines. ? Take any prescribed medicines to control your cholesterol as told by your health care provider.  Manage your diabetes. ? Eating a healthy diet and exercising regularly are important parts of managing your blood sugar. If your blood sugar cannot be managed through diet and exercise, you may need to take medicines. ? Take any prescribed medicines to control your diabetes as told by your health care provider.  Control  your hypertension. ? To reduce your risk of stroke, try to keep your blood pressure below 130/80. ? Eating a healthy diet and exercising regularly are an important part of controlling your blood pressure. If your blood pressure cannot be managed through diet and exercise, you may need to take  medicines. ? Take any prescribed medicines to control hypertension as told by your health care provider. ? Ask your health care provider if you should monitor your blood pressure at home. ? Have your blood pressure checked every year, even if your blood pressure is normal. Blood pressure increases with age and some medical conditions.  Get evaluated for sleep disorders (sleep apnea). Talk to your health care provider about getting a sleep evaluation if you snore a lot or have excessive sleepiness.  Take over-the-counter and prescription medicines only as told by your health care provider. Aspirin or blood thinners (antiplatelets or anticoagulants) may be recommended to reduce your risk of forming blood clots that can lead to stroke.  Make sure that any other medical conditions you have, such as atrial fibrillation or atherosclerosis, are managed. What are the warning signs of a stroke? The warning signs of a stroke can be easily remembered as BEFAST.  B is for balance. Signs include: ? Dizziness. ? Loss of balance or coordination. ? Sudden trouble walking.  E is for eyes. Signs include: ? A sudden change in vision. ? Trouble seeing.  F is for face. Signs include: ? Sudden weakness or numbness of the face. ? The face or eyelid drooping to one side.  A is for arms. Signs include: ? Sudden weakness or numbness of the arm, usually on one side of the body.  S is for speech. Signs include: ? Trouble speaking (aphasia). ? Trouble understanding.  T is for time. ? These symptoms may represent a serious problem that is an emergency. Do not wait to see if the symptoms will go away. Get medical help right away. Call your local emergency services (911 in the [place] Do not drive yourself to the hospital.  Other signs of stroke may include: ? A sudden, severe headache with no known cause. ? Nausea or vomiting. ? Seizure.  Where to find more information: For more information,  visit:  American Stroke Association: www.strokeassociation.org  National Stroke Association: www.stroke.org  Summary  You can prevent a stroke by eating healthy, exercising, not smoking, limiting alcohol intake, and managing any medical conditions you may have.  Do not use any products that contain nicotine or tobacco, such as cigarettes and e-cigarettes. If you need help quitting, ask your health care provider. It may also be helpful to avoid exposure to secondhand smoke.  Remember BEFAST for warning signs of stroke. Get help right away if you or a loved one has any of these signs. This information is not intended to replace advice given to you by your health care provider. Make sure you discuss any questions you have with your health care provider. Document Released: 05/04/2004 Document Revised: 05/02/2016 Document Reviewed: 05/02/2016 Elsevier Interactive Patient Education  [employer].

## 2017-04-19 NOTE — Progress Notes (Signed)
I reviewed note and agree with plan.   Penni Bombard, MD 8/88/2800, 3:49 PM Certified in Neurology, Neurophysiology and Neuroimaging  [facility] [address] [place], [place] [address] [phone]

## 2017-08-24 IMAGING — MR MR HEAD WO/W CM
11 of 14 series · 33 of 48 positions shown · IV contrast (multihance)
Comparison: MRI brain 04/12/2016.

CLINICAL DATA: Slurred speech and RIGHT-sided weakness for 4 days.
History of meningioma. Brain tumor.

EXAM:
MRI HEAD WITHOUT AND WITH CONTRAST
TECHNIQUE: Multiplanar, multiecho pulse sequences of the brain and surrounding
structures were obtained without and with intravenous contrast.
CONTRAST:  17mL MULTIHANCE GADOBENATE DIMEGLUMINE 529 MG/ML IV SOLN

[Series 3: DWI · axial · 3.0mm · 0.94mm/px · z∈[-46,+98]mm · 6 of 99 slices shown (1 of 2)]
[im 1/99]
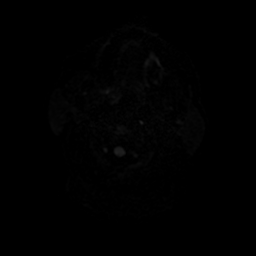
[im 20/99]
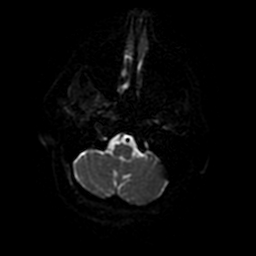
[im 40/99]
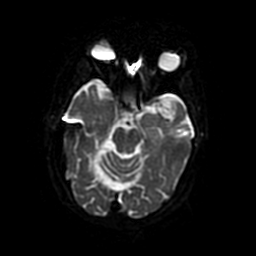
[im 59/99]
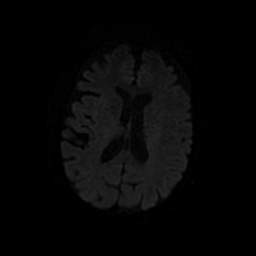
[im 79/99]
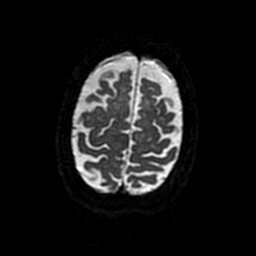
[im 99/99]
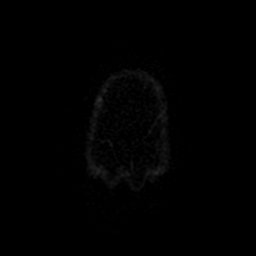

[Series 6: T2 · axial · 5.0mm · 0.47mm/px · 1 of 24 slices shown (1 of 2)]
[im 1/24]
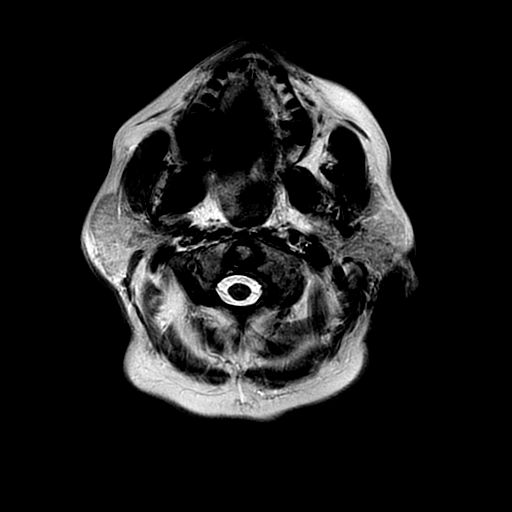

[Series 7: FLAIR · axial · 5.0mm · 0.47mm/px · 1 of 25 slices shown]
[im 1/25]
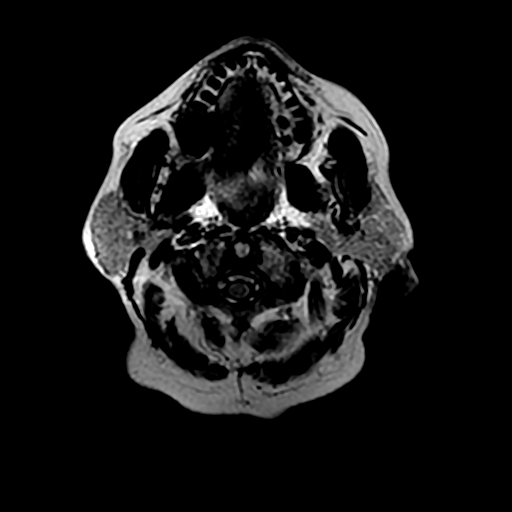

[Series 8: DWI · coronal · 4.0mm · 0.94mm/px · 4 of 68 slices shown (2 of 2)]
[im 1/68]
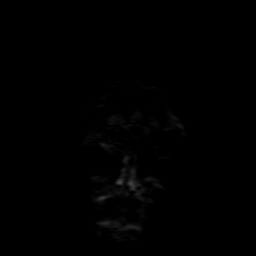
[im 23/68]
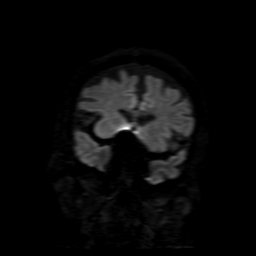
[im 45/68]
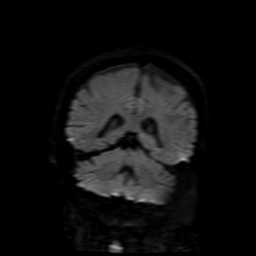
[im 68/68]
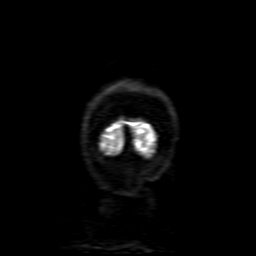

[Series 9: (person_name) · axial · 3.0mm · 0.47mm/px · z∈[-50,+95]mm · 6 of 100 slices shown]
[im 1/100]
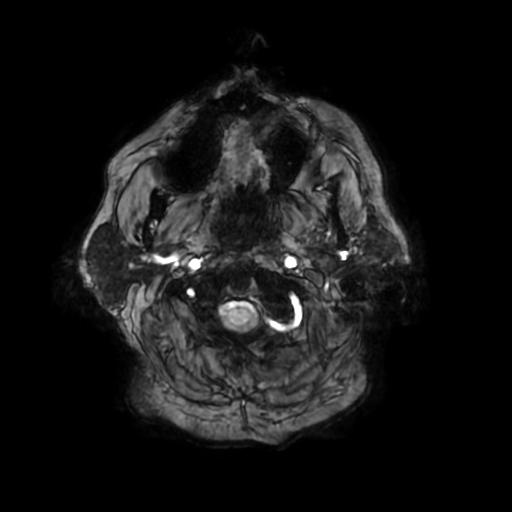
[im 20/100]
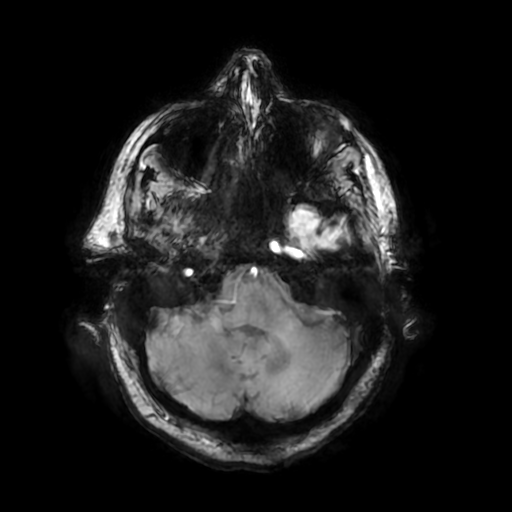
[im 40/100]
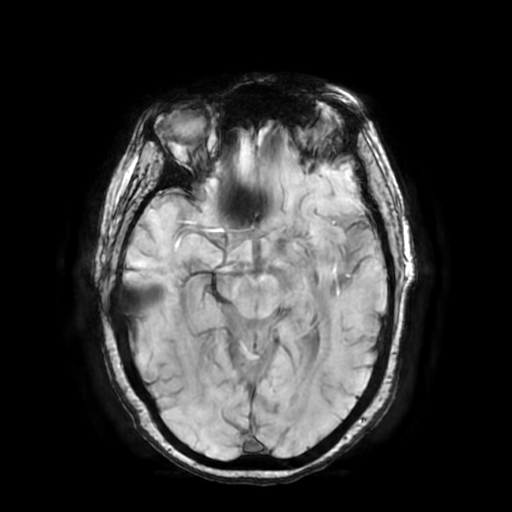
[im 60/100]
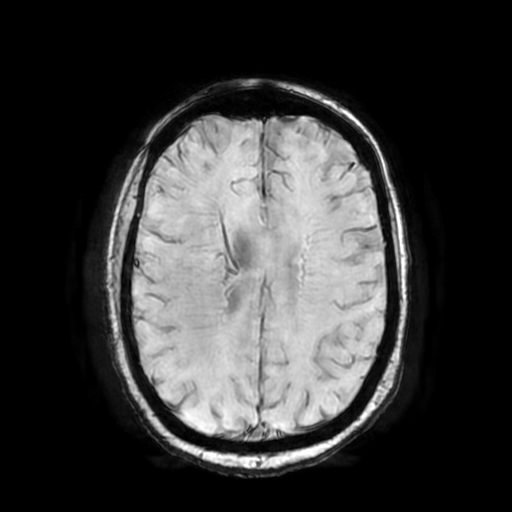
[im 80/100]
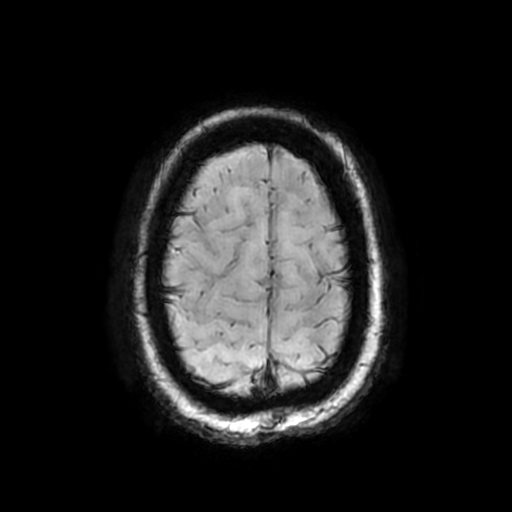
[im 100/100]
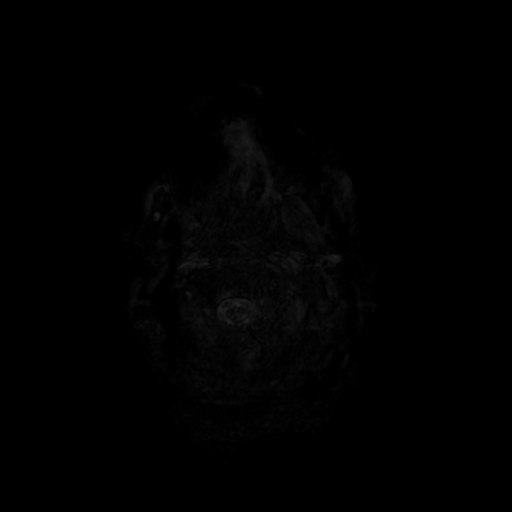

[Series 10: ax 3(person_name) · axial · 3.0mm · 0.94mm/px · z∈[-37,+102]mm · 3 of 48 slices shown]
[im 1/48]
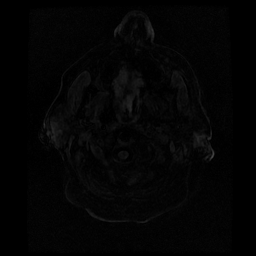
[im 24/48]
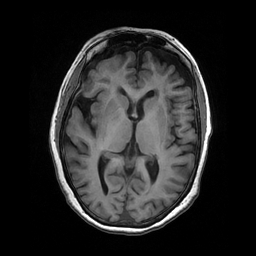
[im 48/48]
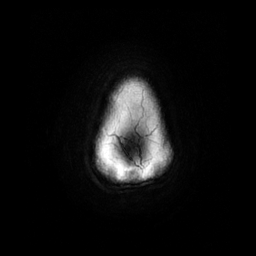

[Series 11: T2 · coronal · 5.0mm · 0.47mm/px · 2 of 27 slices shown (2 of 2)]
[im 1/27]
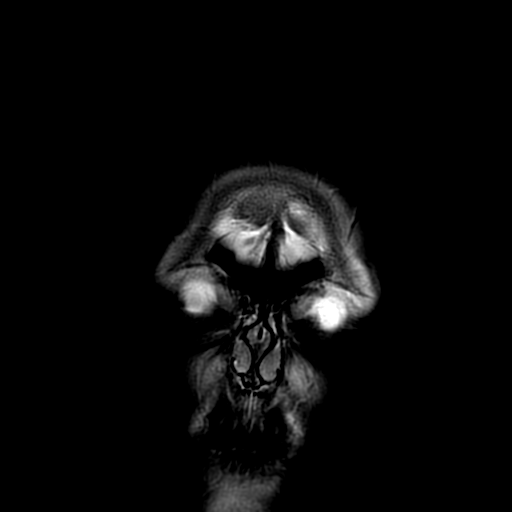
[im 27/27]
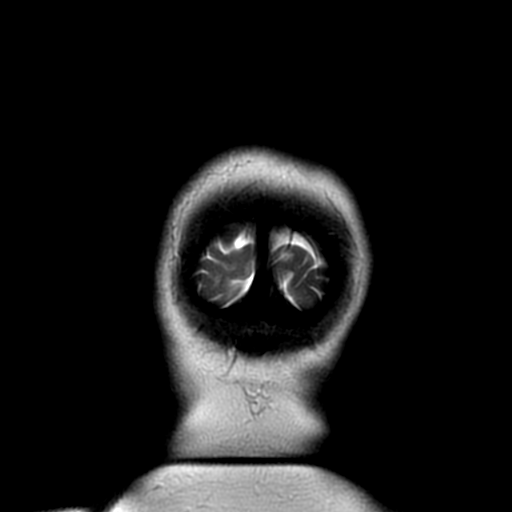

[Series 12: ax 3(person_name) +c · axial · 3.0mm · 0.94mm/px · z∈[-37,+102]mm · 3 of 48 slices shown]
[im 1/48]
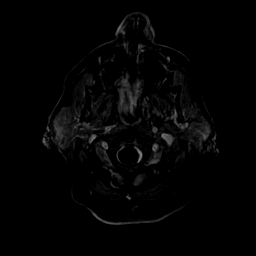
[im 24/48]
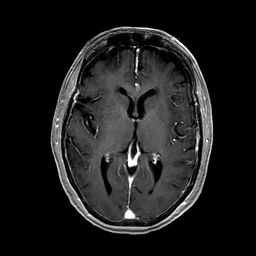
[im 48/48]
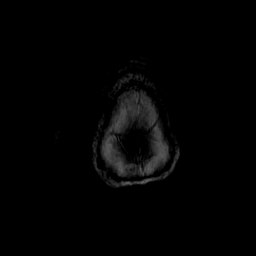

[Series 13: T1 · coronal · 5.0mm · 0.47mm/px · 2 of 27 slices shown]
[im 1/27]
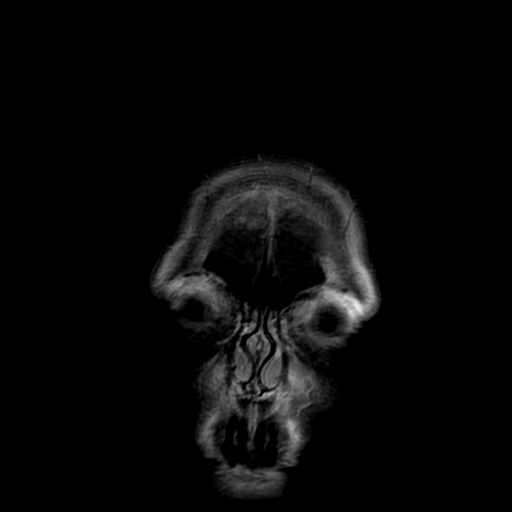
[im 27/27]
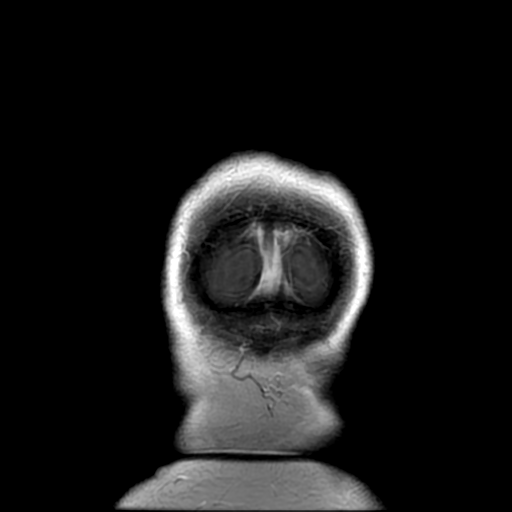

[Series 350: ADC · axial · 3.0mm · 0.94mm/px · z∈[-46,+98]mm · 3 of 50 slices shown (1 of 2)]
[im 1/50]
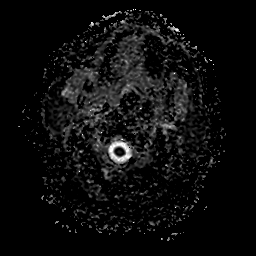
[im 25/50]
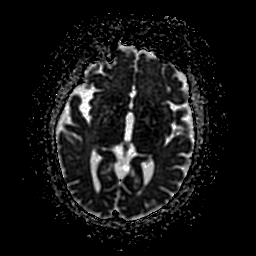
[im 50/50]
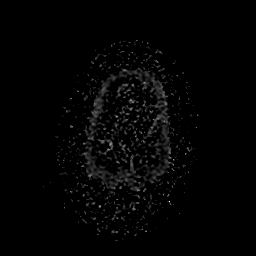

[Series 850: ADC · coronal · 4.0mm · 0.94mm/px · 2 of 34 slices shown (2 of 2)]
[im 1/34]
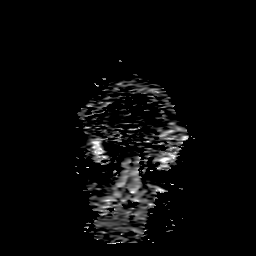
[im 34/34]
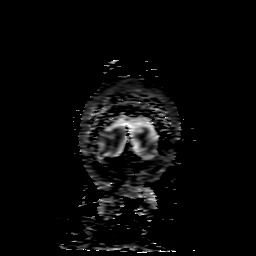

[33 of 48 positions shown; findings below may reference images not displayed]

FINDINGS: Brain: There is an acute brainstem infarct on the LEFT, affecting
the mid pons. This appears nonhemorrhagic. Developing cytotoxic
edema on T2 and FLAIR imaging. No hydrocephalus or extra-axial
fluid.

Moderate atrophy. Chronic microvascular ischemic change of a
mild-to-moderate nature.

Post infusion imaging demonstrates interval stability of small LEFT
parafalcine meningioma, 8 mm diameter, and small 4 mm meningioma
over the convexity, also on the LEFT. The Parafalcine meningioma is
adjacent to, but not invading, the superior sagittal sinus.

Vascular: Normal flow voids.

Skull and upper cervical spine: Normal marrow signal.

Sinuses/Orbits: Negative.  BILATERAL cataract extraction.

Other: None.
IMPRESSION: Acute LEFT paramedian mid pontine infarction, nonhemorrhagic. No
evidence for vertebral or basilar thrombosis.

Stable small subcentimeter meningiomas, without significant growth
from Brisson.

Atrophy and small vessel disease.

## 2017-08-24 IMAGING — CT CT ANGIO HEAD
1 of 11 series · 5 of 47 positions shown · IV contrast (Omni 300)
Comparison: Brain MRI from earlier today

CLINICAL DATA: Follow-up stroke.

EXAM:
CT ANGIOGRAPHY HEAD
TECHNIQUE: Multidetector CT imaging of the head was performed using the
standard protocol during bolus administration of intravenous
contrast. Multiplanar CT image reconstructions and MIPs were
obtained to evaluate the vascular anatomy.
CONTRAST:  50 cc Isovue 370 intravenous

[Series 6: cow 2.0 · axial · 0.44mm/px · z∈[+956,+1060]mm · 5 of 80 slices shown]
[im 14/80  brain]
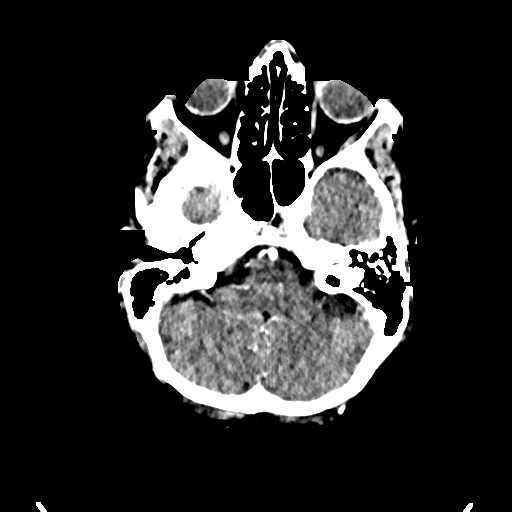
[im 27/80  bone]
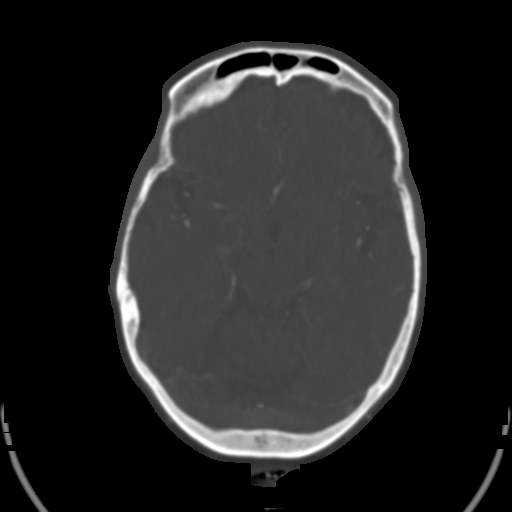
[im 40/80  brain]
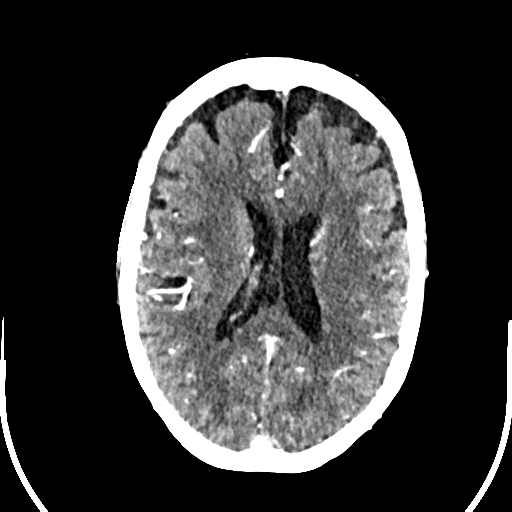
[im 53/80  bone]
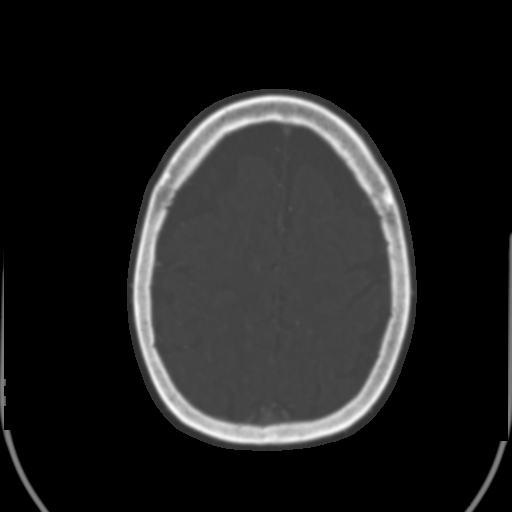
[im 66/80  brain]
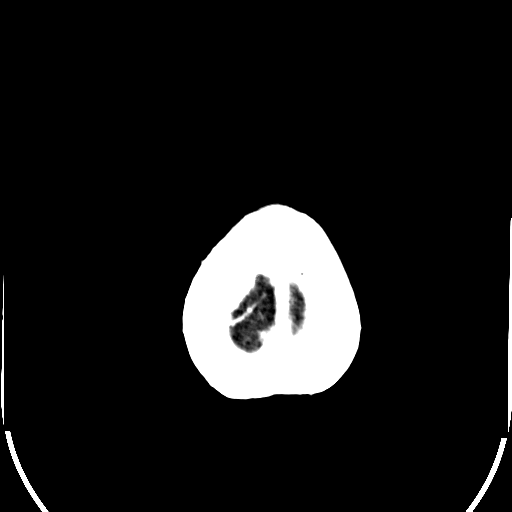

[5 of 47 positions shown; findings below may reference images not displayed]

FINDINGS: CT HEAD

Brain: Acute left pontine infarct by MRI is not clearly visible. No
evidence of interval infarct or hemorrhage. Microvascular ischemic
change in the cerebral white matter. No 9 mm hemangioma, left
parafalcine at the vertex.

Vascular: See below

Skull: No acute or aggressive finding

Sinuses: Negative

Orbits: Bilateral cataract resection.

CTA HEAD

Anterior circulation: Heavy atherosclerotic calcification of the
carotid siphons with intermittent mild narrowing. No focal or flow
limiting stenosis noted. Mild hypoplasia of the right A1 segment. No
major branch occlusion noted. Negative for aneurysm.

Posterior circulation: Heavy calcification of the bilateral
vertebral arteries with moderate to advanced stenosis on the right
just beyond the dura. Mild mid basilar narrowing. No evidence of
basilar dissection or high-grade stenosis. Hypoplastic left P1
segment. Symmetric opacification of the mildly irregular PCAs.

Venous sinuses: Patent

Anatomic variants: None other than described above

Delayed phase: Enhancing meningioma, left parafalcine at the vertex
measuring At 9 mm.
IMPRESSION: 1. No acute arterial finding.
2. Intracranial atherosclerosis with notably extensive plaque on the
bilateral V4 segments causing moderate to advanced narrowing on the
right.

## 2017-12-20 ENCOUNTER — Inpatient Hospital Stay: Payer: Medicare Other | Admitting: Adult Health

## 2023-02-26 ENCOUNTER — Other Ambulatory Visit: Payer: Self-pay | Admitting: Family Medicine

## 2023-02-26 ENCOUNTER — Ambulatory Visit
Admission: RE | Admit: 2023-02-26 | Discharge: 2023-02-26 | Disposition: A | Discharge status: Home / Self Care | Payer: Medicare HMO | Source: Ambulatory Visit | Attending: Family Medicine | Admitting: Family Medicine

## 2023-02-26 DIAGNOSIS — S0093XD Contusion of unspecified part of head, subsequent encounter: Secondary | ICD-10-CM
